# Patient Record
Sex: Male | Born: 1970 | Race: Black or African American | Hispanic: No | Marital: Married | Smoking: Never smoker
Health system: Southern US, Community
[De-identification: ages and names within clinical notes are randomized; demographics above are authoritative.]

## PROBLEM LIST (undated history)

## (undated) DIAGNOSIS — I10 Essential (primary) hypertension: Secondary | ICD-10-CM

## (undated) DIAGNOSIS — I639 Cerebral infarction, unspecified: Secondary | ICD-10-CM

## (undated) DIAGNOSIS — E119 Type 2 diabetes mellitus without complications: Secondary | ICD-10-CM

## (undated) HISTORY — DX: Essential (primary) hypertension: I10

## (undated) HISTORY — DX: Cerebral infarction, unspecified: I63.9

## (undated) HISTORY — DX: Type 2 diabetes mellitus without complications: E11.9

---

## 2003-03-19 ENCOUNTER — Emergency Department (HOSPITAL_COMMUNITY): Admission: EM | Admit: 2003-03-19 | Discharge: 2003-03-19 | Payer: Self-pay | Admitting: Emergency Medicine

## 2006-11-03 ENCOUNTER — Ambulatory Visit (HOSPITAL_COMMUNITY): Admission: RE | Admit: 2006-11-03 | Discharge: 2006-11-03 | Payer: Self-pay | Admitting: Pediatrics

## 2010-09-09 NOTE — Procedures (Signed)
NAMEREZA, CRYMES NO.:  [id number]   MEDICAL RECORD NO.:  [id number]          PATIENT TYPE:  OUT   LOCATION:  [facility]                          FACILITY:  [facility]   PHYSICIAN:  Francoise Schaumann. Halm, DO, FAAPDATE OF BIRTH:  11-02-1970   DATE OF PROCEDURE:  11/03/2006  DATE OF DISCHARGE:                                  STRESS TEST   STUDY BEING PERFORMED:  Exercise treadmill testing under Bruce protocol.   INDICATION FOR TEST:  Pre-exercise and weight loss program screening.   The patient presents with no complaints or symptoms.  He takes  antihypertensive medications including a beta blocker, which we asked  him to hold on the day of his study.   Consent was obtained from the patient after reviewing risks and benefits  of the study.   The patient underwent standard Bruce protocol exercise treadmill testing  and reached an exercise level of over 10 METs, reaching and exceeding  his goal heart rate of 157.  He had no dysrhythmia response to exercise,  a normal blood pressure response to exercise, and he had no symptoms  during the exercise process.  The patient had a maximum ST depression  calculated at 1.7 mm in aVR as his worst EKG tracing.  He had a normal  recovery period.   IMPRESSION:  Excellent tolerance to exercise with negative EKG changes  for ischemia based on Bruce protocol stress testing.      Francoise Schaumann. Milford Cage, DO, FAAP  Electronically Signed     [facility]  D:  11/03/2006  T:  11/03/2006  Job:  [id number]

## 2011-04-28 HISTORY — PX: VASECTOMY: SHX75

## 2012-09-07 ENCOUNTER — Encounter: Payer: Self-pay | Admitting: Medical

## 2012-09-07 ENCOUNTER — Ambulatory Visit (INDEPENDENT_AMBULATORY_CARE_PROVIDER_SITE_OTHER): Payer: 59 | Admitting: Medical

## 2012-09-07 VITALS — BP 118/82 | HR 68 | Temp 98.2°F | Resp 16 | Ht 71.0 in | Wt 245.0 lb

## 2012-09-07 DIAGNOSIS — R81 Glycosuria: Secondary | ICD-10-CM

## 2012-09-07 DIAGNOSIS — Z23 Encounter for immunization: Secondary | ICD-10-CM

## 2012-09-07 DIAGNOSIS — E669 Obesity, unspecified: Secondary | ICD-10-CM

## 2012-09-07 DIAGNOSIS — Z0000 Encounter for general adult medical examination without abnormal findings: Secondary | ICD-10-CM

## 2012-09-07 LAB — COMPREHENSIVE METABOLIC PANEL
ALT: 16 U/L (ref 0–53)
Alkaline Phosphatase: 86 U/L (ref 39–117)
CO2: 28 mEq/L (ref 19–32)
Creat: 1.19 mg/dL (ref 0.50–1.35)
Total Bilirubin: 0.5 mg/dL (ref 0.3–1.2)

## 2012-09-07 LAB — CBC WITH DIFFERENTIAL/PLATELET
Eosinophils Relative: 1 % (ref 0–5)
HCT: 42 % (ref 39.0–52.0)
Lymphocytes Relative: 32 % (ref 12–46)
Lymphs Abs: 2.4 10*3/uL (ref 0.7–4.0)
MCH: 27.5 pg (ref 26.0–34.0)
MCV: 83.8 fL (ref 78.0–100.0)
Monocytes Absolute: 0.4 10*3/uL (ref 0.1–1.0)
RBC: 5.01 MIL/uL (ref 4.22–5.81)
WBC: 7.5 10*3/uL (ref 4.0–10.5)

## 2012-09-07 LAB — POCT URINALYSIS DIPSTICK
Ketones, UA: NEGATIVE
Leukocytes, UA: NEGATIVE
Urobilinogen, UA: NEGATIVE
pH, UA: 5

## 2012-09-07 LAB — LIPID PANEL
HDL: 50 mg/dL (ref >39)
LDL Cholesterol: 120 mg/dL — ABNORMAL HIGH (ref 0–99)
Total CHOL/HDL Ratio: 3.8 Ratio
Triglycerides: 90 mg/dL (ref <150)
VLDL: 18 mg/dL (ref 0–40)

## 2012-09-07 NOTE — Progress Notes (Signed)
Subjective:   HPI  Danny Stevens is a 42 y.o. male who presents for a complete physical.  New patient today.   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  His nephew died unexpectedly a month ago, think he had brain aneurysm or heart issue, age 73yo.  This encouraged him to get a physical.    Preventative care: Last ophthalmology visit:n/a Last dental visit:yes- Dr. Collier Bullock Last colonoscopy:n/a Last prostate exam: n/a Last EKG:09/07/12 Last labs: ?  Prior vaccinations: TD or Tdap:09/07/12  Influenza:never Pneumococcal:n/a Shingles/Zostavax:n/a  Advanced directive:n/a Health care power of attorney:n/a Living will:n/a  Concerns: None  History reviewed. No pertinent past medical history.  Past Surgical History  Procedure Laterality Date  . Vasectomy  2013    Family History  Problem Relation Age of Onset  . Diabetes Mother   . Hypertension Mother   . Stroke Mother     TIA  . Hypertension Father   . Cancer Neg Hx   . Heart disease Neg Hx     History   Social History  . Marital Status: Married    Spouse Name: N/A    Number of Children: N/A  . Years of Education: N/A   Occupational History  . Not on file.   Social History Main Topics  . Smoking status: Never Smoker   . Smokeless tobacco: Not on file  . Alcohol Use: No  . Drug Use: No  . Sexually Active: Not on file   Other Topics Concern  . Not on file   Social History Narrative   Married, 9yo and 4yo sons.   Exercise - walks some, running after kids.   Works for [employer], [employer] fast [employer].      No current outpatient prescriptions on file prior to visit.   No current facility-administered medications on file prior to visit.    No Known Allergies   Reviewed their medical, surgical, family, social, medication, and allergy history and updated chart as appropriate.  Review of Systems Constitutional: -fever, -chills, -sweats, -unexpected weight change, -decreased  appetite, -fatigue Allergy: -sneezing, -itching, -congestion Dermatology: -changing moles, --rash, -lumps ENT: -runny nose, -ear pain, -sore throat, -hoarseness, -sinus pain, -teeth pain, - ringing in ears, -hearing loss, -nosebleeds Cardiology: -chest pain, -palpitations, -swelling, -difficulty breathing when lying flat, -waking up short of breath Respiratory: -cough, -shortness of breath, -difficulty breathing with exercise or exertion, -wheezing, -coughing up blood Gastroenterology: -abdominal pain, -nausea, -vomiting, -diarrhea, -constipation, -blood in stool, -changes in bowel movement, -difficulty swallowing or eating Hematology: -bleeding, -bruising  Musculoskeletal: -joint aches, -muscle aches, -joint swelling, -back pain, -neck pain, -cramping, -changes in gait Ophthalmology: denies vision changes, eye redness, itching, discharge Urology: -burning with urination, -difficulty urinating, -blood in urine, -urinary frequency, -urgency, -incontinence Neurology: -headache, -weakness, -tingling, -numbness, -memory loss, -falls, -dizziness Psychology: -depressed mood, -agitation, -sleep problems     Objective:   Physical Exam  Filed Vitals:   09/07/12 1156  BP: 118/82  Pulse: 68  Temp: 98.2 F (36.8 C)  Resp: 16    General appearance: alert, no distress, WD/WN, obese AA male Skin: right buttock with small flat brown patch of skin he reports as a birth mark, scar left forearm, linear approx 5cm, few scattered benign appearing macules, no other skin findings HEENT: normocephalic, conjunctiva/corneas normal, sclerae anicteric, PERRLA, EOMi, nares patent, no discharge or erythema, pharynx normal Oral cavity: MMM, tongue normal, teeth in good repair Neck: supple, no lymphadenopathy, no thyromegaly, no masses, normal ROM, no  bruits Chest: non tender, normal shape and expansion Heart: RRR, normal S1, S2, no murmurs Lungs: CTA bilaterally, no wheezes, rhonchi, or rales Abdomen: +bs, soft,  small umbilical hernia, non tender, non distended, no masses, no hepatomegaly, no splenomegaly, no bruits Back: non tender, normal ROM, no scoliosis Musculoskeletal: upper extremities non tender, no obvious deformity, normal ROM throughout, lower extremities non tender, no obvious deformity, normal ROM throughout Extremities: no edema, no cyanosis, no clubbing Pulses: 2+ symmetric, upper and lower extremities, normal cap refill Neurological: alert, oriented x 3, CN2-12 intact, strength normal upper extremities and lower extremities, sensation normal throughout, DTRs 2+ throughout, no cerebellar signs, gait normal Psychiatric: normal affect, behavior normal, pleasant  GU: normal male external genitalia, circumcised, nontender, no masses, no hernia, no lymphadenopathy Rectal: anus normal tone, prostrate WNL, no nodules   Adult ECG Report  Indication: physical  Rate: 86 bpm  Rhythm: normal sinus rhythm  QRS Axis: -15 degrees  PR Interval:  QRS Duration:  QTc: 459 ms  Conduction Disturbances: none  Other Abnormalities: left axis deviation, poor R wave progression,   Patient's cardiac risk factors are: male gender and obesity (BMI >= 30 kg/m2).  EKG comparison: no prior  Narrative Interpretation: left axis deviation, poor R wave progression, otherwise normal EKG   Assessment and Plan :      Encounter Diagnoses  Name Primary?  . Routine general medical examination at a health care facility Yes  . Need for Tdap vaccination   . Obesity, unspecified   . Glucosuria     Physical exam - discussed healthy lifestyle, diet, exercise, preventative care, vaccinations, and addressed their concerns.  Advised yearly eye doctor visit.   tdap vaccine, VIS and counseling given  Obesity - advised lifestyle changes  Glucosuria, Follow-up pending labs

## 2012-12-02 ENCOUNTER — Other Ambulatory Visit (HOSPITAL_COMMUNITY): Payer: Self-pay | Admitting: Dentistry

## 2012-12-05 ENCOUNTER — Other Ambulatory Visit (HOSPITAL_COMMUNITY): Payer: Self-pay | Admitting: Dentistry

## 2014-10-04 ENCOUNTER — Ambulatory Visit: Payer: 59 | Admitting: Nutrition

## 2014-10-04 ENCOUNTER — Encounter: Payer: 59 | Admitting: Nutrition

## 2014-10-25 ENCOUNTER — Other Ambulatory Visit: Payer: Self-pay | Admitting: Internal Medicine

## 2014-10-25 DIAGNOSIS — I10 Essential (primary) hypertension: Secondary | ICD-10-CM

## 2014-11-21 ENCOUNTER — Inpatient Hospital Stay: Admission: RE | Admit: 2014-11-21 | Payer: 59 | Source: Ambulatory Visit

## 2014-11-21 ENCOUNTER — Other Ambulatory Visit: Payer: 59

## 2014-11-22 ENCOUNTER — Encounter: Payer: Self-pay | Admitting: Nutrition

## 2014-11-22 ENCOUNTER — Encounter: Payer: 59 | Attending: Internal Medicine | Admitting: Nutrition

## 2014-11-22 VITALS — Ht 72.0 in | Wt 227.0 lb

## 2014-11-22 DIAGNOSIS — Z713 Dietary counseling and surveillance: Secondary | ICD-10-CM | POA: Diagnosis not present

## 2014-11-22 DIAGNOSIS — E118 Type 2 diabetes mellitus with unspecified complications: Secondary | ICD-10-CM

## 2014-11-22 DIAGNOSIS — E669 Obesity, unspecified: Secondary | ICD-10-CM

## 2014-11-22 DIAGNOSIS — IMO0002 Reserved for concepts with insufficient information to code with codable children: Secondary | ICD-10-CM

## 2014-11-22 DIAGNOSIS — Z6830 Body mass index (BMI) 30.0-30.9, adult: Secondary | ICD-10-CM | POA: Insufficient documentation

## 2014-11-22 DIAGNOSIS — E1165 Type 2 diabetes mellitus with hyperglycemia: Secondary | ICD-10-CM | POA: Diagnosis not present

## 2014-11-22 NOTE — Progress Notes (Signed)
  Medical Nutrition Therapy:  Appt start time: 0800 end time:  0930.  Assessment:  Primary concerns today: Diabetes. New Diagnosos about a month or two ago. LIves with wife and kids. His wife does cooking and shopping.  Most foods are grilled and baked and some frying. Eats usually 2 meals per day. Metformin 500 mg BID. Has the Freestyle meter but needs education on using it. Most recent A1C was 9%. He didn't know he was diabetic and found out by annual exam. Strong family history of Dm. Physical activity: walks some. Work full times in the [employer].. Usual weight 260-300 lbs abut a year ago. Has lost about 7 lbs in the last month. DIet is excessive in CHO, low in fresh fruits, vegetables and whole grains and hIgher in fat, sodium and processed foods and sugary drinks.  Preferred Learning Style:    No preference indicated    Learning Readiness:   Ready  Change in progress   MEDICATIONS: See list   DIETARY INTAKE:   24-hr recall:  B ( AM): Banana and water  Snk ( AM):   L ( PM): Cheeseburger and fries, Regular soda Snk ( PM): None D ( PM): Chicken filet sandwiches 1 with tomato and spinach, baked potatoes fries(10-12), Koolaid none Snk ( PM): None Beverages: Water, soda, gatorade, koolaid,   Usual physical activity: walking on the job; and walks the track twice a week.  Estimated energy needs: 2000 calories 225 g carbohydrates 150 g protein 56 g fat  Progress Towards Goal(s):  In progress.   Nutritional Diagnosis:  NB-1.1 Food and nutrition-related knowledge deficit As related to Diabetes.  As evidenced by A1C 9%.    Intervention:  Nutrition and diabetes education provided on disease, CHO counting, meal planning, My plate, portion sizes, balancing meals, target ranges for blood sugars, signs/symptoms of hyper/hypoglycemia and treatment of both, monitoring and training for use of Freestyle Freedom Lite Meter, foot, dental and eye care and prevention of complication  following a low fat low sodium high fiber diet..  Goals:  1. Follow My Plate. 2 l Cut out all sweets, cakes, cookies, pies, desserts, sodas, juices and koolaid. 3. Eat three balanced meals per day. 4. Increase fresh fruits and vegetables. 5. Drink only water.  Test blood sugars twice a day; am and 2 hours after supper or bedtime. 6. Exercise 30-60 minutes 4 -5 days per week. 7. Lose 1-2 lbs per week til next visit. 8. Get A1C to 7% in three months.  Teaching Method Utilized:  Visual Auditory Hands on  Handouts given during visit include:  MY Plate  Meal Plan Card  Barriers to learning/adherence to lifestyle change: None  Demonstrated degree of understanding via:  Teach Back   Monitoring/Evaluation:  Dietary intake, exercise, meal planning, SBG, and body weight in 1 month(s).

## 2014-11-22 NOTE — Patient Instructions (Signed)
Goals:  1. Follow My Plate. 2 l Cut out all sweets, cakes, cookies, pies, desserts, sodas, juices and koolaid. 3. Eat three balanced meals per day. 4. Increase fresh fruits and vegetables. 5. Drink only water.  Test blood sugars twice a day; am and 2 hours after supper or bedtime. 6. Exercise 30-60 minutes 4 -5 days per week. 7. Lose 1-2 lbs per week til next visit. 8. Get A1C to 7% in three months.

## 2015-01-04 ENCOUNTER — Ambulatory Visit: Payer: 59 | Admitting: Nutrition

## 2015-01-04 ENCOUNTER — Telehealth: Payer: Self-pay | Admitting: Nutrition

## 2015-01-04 NOTE — Telephone Encounter (Signed)
Vm to return call to reschedule missed appointment. PC 

## 2015-01-07 ENCOUNTER — Telehealth: Payer: Self-pay | Admitting: Medical

## 2015-01-07 NOTE — Telephone Encounter (Signed)
Left message for pt to call needs appt for CPE per [facility].

## 2015-01-08 ENCOUNTER — Telehealth: Payer: Self-pay | Admitting: Family Medicine

## 2015-01-08 NOTE — Telephone Encounter (Signed)
Pt needs cpe per [facility].  Left message on home ph voice mail .  Cell number not good.

## 2016-04-17 ENCOUNTER — Encounter (HOSPITAL_COMMUNITY): Payer: Self-pay | Admitting: Emergency Medicine

## 2016-04-17 ENCOUNTER — Inpatient Hospital Stay (HOSPITAL_COMMUNITY): Payer: 59

## 2016-04-17 ENCOUNTER — Emergency Department (HOSPITAL_COMMUNITY): Payer: 59

## 2016-04-17 ENCOUNTER — Inpatient Hospital Stay (HOSPITAL_COMMUNITY)
Admission: EM | Admit: 2016-04-17 | Discharge: 2016-04-21 | DRG: 064 | Disposition: A | Discharge status: Home / Self Care | Payer: 59 | Attending: Neurology | Admitting: Neurology

## 2016-04-17 DIAGNOSIS — I69320 Aphasia following cerebral infarction: Secondary | ICD-10-CM

## 2016-04-17 DIAGNOSIS — G936 Cerebral edema: Secondary | ICD-10-CM | POA: Diagnosis present

## 2016-04-17 DIAGNOSIS — Z7984 Long term (current) use of oral hypoglycemic drugs: Secondary | ICD-10-CM | POA: Diagnosis not present

## 2016-04-17 DIAGNOSIS — E1165 Type 2 diabetes mellitus with hyperglycemia: Secondary | ICD-10-CM | POA: Diagnosis not present

## 2016-04-17 DIAGNOSIS — D869 Sarcoidosis, unspecified: Secondary | ICD-10-CM | POA: Diagnosis present

## 2016-04-17 DIAGNOSIS — I6789 Other cerebrovascular disease: Secondary | ICD-10-CM | POA: Diagnosis not present

## 2016-04-17 DIAGNOSIS — I618 Other nontraumatic intracerebral hemorrhage: Secondary | ICD-10-CM | POA: Diagnosis present

## 2016-04-17 DIAGNOSIS — I161 Hypertensive emergency: Secondary | ICD-10-CM | POA: Diagnosis present

## 2016-04-17 DIAGNOSIS — R531 Weakness: Secondary | ICD-10-CM | POA: Diagnosis present

## 2016-04-17 DIAGNOSIS — R29702 NIHSS score 2: Secondary | ICD-10-CM | POA: Diagnosis present

## 2016-04-17 DIAGNOSIS — R591 Generalized enlarged lymph nodes: Secondary | ICD-10-CM

## 2016-04-17 DIAGNOSIS — E876 Hypokalemia: Secondary | ICD-10-CM | POA: Diagnosis present

## 2016-04-17 DIAGNOSIS — E785 Hyperlipidemia, unspecified: Secondary | ICD-10-CM | POA: Diagnosis present

## 2016-04-17 DIAGNOSIS — I612 Nontraumatic intracerebral hemorrhage in hemisphere, unspecified: Secondary | ICD-10-CM | POA: Diagnosis not present

## 2016-04-17 DIAGNOSIS — Z6831 Body mass index (BMI) 31.0-31.9, adult: Secondary | ICD-10-CM | POA: Diagnosis not present

## 2016-04-17 DIAGNOSIS — E119 Type 2 diabetes mellitus without complications: Secondary | ICD-10-CM | POA: Diagnosis present

## 2016-04-17 DIAGNOSIS — I160 Hypertensive urgency: Secondary | ICD-10-CM | POA: Diagnosis present

## 2016-04-17 DIAGNOSIS — I619 Nontraumatic intracerebral hemorrhage, unspecified: Secondary | ICD-10-CM | POA: Diagnosis present

## 2016-04-17 DIAGNOSIS — D649 Anemia, unspecified: Secondary | ICD-10-CM | POA: Diagnosis present

## 2016-04-17 DIAGNOSIS — Z9119 Patient's noncompliance with other medical treatment and regimen: Secondary | ICD-10-CM

## 2016-04-17 DIAGNOSIS — R599 Enlarged lymph nodes, unspecified: Secondary | ICD-10-CM

## 2016-04-17 DIAGNOSIS — IMO0002 Reserved for concepts with insufficient information to code with codable children: Secondary | ICD-10-CM

## 2016-04-17 DIAGNOSIS — I610 Nontraumatic intracerebral hemorrhage in hemisphere, subcortical: Secondary | ICD-10-CM | POA: Diagnosis not present

## 2016-04-17 DIAGNOSIS — I10 Essential (primary) hypertension: Secondary | ICD-10-CM | POA: Diagnosis present

## 2016-04-17 DIAGNOSIS — E669 Obesity, unspecified: Secondary | ICD-10-CM | POA: Diagnosis present

## 2016-04-17 LAB — CBC
HCT: 42.7 % (ref 39.0–52.0)
HEMOGLOBIN: 14.1 g/dL (ref 13.0–17.0)
MCH: 29.1 pg (ref 26.0–34.0)
MCHC: 33 g/dL (ref 30.0–36.0)
MCV: 88 fL (ref 78.0–100.0)
PLATELETS: 234 10*3/uL (ref 150–400)
RBC: 4.85 MIL/uL (ref 4.22–5.81)
RDW: 13.3 % (ref 11.5–15.5)
WBC: 6.1 10*3/uL (ref 4.0–10.5)

## 2016-04-17 LAB — LIPID PANEL
CHOLESTEROL: 198 mg/dL (ref 0–200)
HDL: 60 mg/dL (ref >40)
LDL Cholesterol: 120 mg/dL — ABNORMAL HIGH (ref 0–99)
TRIGLYCERIDES: 91 mg/dL (ref <150)
Total CHOL/HDL Ratio: 3.3 RATIO
VLDL: 18 mg/dL (ref 0–40)

## 2016-04-17 LAB — COMPREHENSIVE METABOLIC PANEL
ALBUMIN: 3.4 g/dL — AB (ref 3.5–5.0)
ALT: 19 U/L (ref 17–63)
ANION GAP: 7 (ref 5–15)
AST: 20 U/L (ref 15–41)
Alkaline Phosphatase: 106 U/L (ref 38–126)
BUN: 16 mg/dL (ref 6–20)
CALCIUM: 9 mg/dL (ref 8.9–10.3)
CO2: 30 mmol/L (ref 22–32)
CREATININE: 1.38 mg/dL — AB (ref 0.61–1.24)
Chloride: 102 mmol/L (ref 101–111)
GFR calc Af Amer: >60 mL/min (ref >60)
GFR calc non Af Amer: >60 mL/min (ref >60)
GLUCOSE: 200 mg/dL — AB (ref 65–99)
Potassium: 3.1 mmol/L — ABNORMAL LOW (ref 3.5–5.1)
SODIUM: 139 mmol/L (ref 135–145)
TOTAL PROTEIN: 7.5 g/dL (ref 6.5–8.1)
Total Bilirubin: 0.5 mg/dL (ref 0.3–1.2)

## 2016-04-17 LAB — PROTIME-INR
INR: 0.93
PROTHROMBIN TIME: 12.4 s (ref 11.4–15.2)

## 2016-04-17 LAB — URINALYSIS, ROUTINE W REFLEX MICROSCOPIC
BILIRUBIN URINE: NEGATIVE
Glucose, UA: 250 mg/dL — AB
KETONES UR: NEGATIVE mg/dL
Leukocytes, UA: NEGATIVE
NITRITE: NEGATIVE
PROTEIN: 30 mg/dL — AB
Specific Gravity, Urine: 1.01 (ref 1.005–1.030)
pH: 6 (ref 5.0–8.0)

## 2016-04-17 LAB — I-STAT CHEM 8, ED
BUN: 16 mg/dL (ref 6–20)
CALCIUM ION: 1.13 mmol/L — AB (ref 1.15–1.40)
CHLORIDE: 100 mmol/L — AB (ref 101–111)
CREATININE: 1.3 mg/dL — AB (ref 0.61–1.24)
Glucose, Bld: 196 mg/dL — ABNORMAL HIGH (ref 65–99)
HEMATOCRIT: 42 % (ref 39.0–52.0)
Hemoglobin: 14.3 g/dL (ref 13.0–17.0)
Potassium: 3.2 mmol/L — ABNORMAL LOW (ref 3.5–5.1)
SODIUM: 142 mmol/L (ref 135–145)
TCO2: 28 mmol/L (ref 0–100)

## 2016-04-17 LAB — DIFFERENTIAL
BASOS ABS: 0 10*3/uL (ref 0.0–0.1)
Basophils Relative: 0 %
Eosinophils Absolute: 0.2 10*3/uL (ref 0.0–0.7)
Eosinophils Relative: 3 %
LYMPHS ABS: 1.4 10*3/uL (ref 0.7–4.0)
LYMPHS PCT: 23 %
Monocytes Absolute: 0.6 10*3/uL (ref 0.1–1.0)
Monocytes Relative: 10 %
NEUTROS ABS: 3.9 10*3/uL (ref 1.7–7.7)
NEUTROS PCT: 64 %

## 2016-04-17 LAB — URINALYSIS, MICROSCOPIC (REFLEX)
Bacteria, UA: NONE SEEN
Squamous Epithelial / HPF: NONE SEEN
WBC, UA: NONE SEEN WBC/hpf (ref 0–5)

## 2016-04-17 LAB — I-STAT TROPONIN, ED: Troponin i, poc: 0.03 ng/mL (ref 0.00–0.08)

## 2016-04-17 LAB — RAPID URINE DRUG SCREEN, HOSP PERFORMED
AMPHETAMINES: NOT DETECTED
BARBITURATES: NOT DETECTED
Benzodiazepines: NOT DETECTED
Cocaine: NOT DETECTED
OPIATES: NOT DETECTED
TETRAHYDROCANNABINOL: NOT DETECTED

## 2016-04-17 LAB — MRSA PCR SCREENING: MRSA by PCR: NEGATIVE

## 2016-04-17 LAB — GLUCOSE, CAPILLARY
GLUCOSE-CAPILLARY: 271 mg/dL — AB (ref 65–99)
GLUCOSE-CAPILLARY: 105 mg/dL — AB (ref 65–99)
Glucose-Capillary: 241 mg/dL — ABNORMAL HIGH (ref 65–99)
Glucose-Capillary: 225 mg/dL — ABNORMAL HIGH (ref 65–99)

## 2016-04-17 LAB — CBG MONITORING, ED: Glucose-Capillary: 196 mg/dL — ABNORMAL HIGH (ref 65–99)

## 2016-04-17 LAB — ETHANOL: Alcohol, Ethyl (B): <5 mg/dL (ref <5)

## 2016-04-17 LAB — APTT: aPTT: 31 seconds (ref 24–36)

## 2016-04-17 LAB — TYPE AND SCREEN
ABO/RH(D): A POS
Antibody Screen: NEGATIVE

## 2016-04-17 LAB — I-STAT CG4 LACTIC ACID, ED: Lactic Acid, Venous: 1.71 mmol/L (ref 0.5–1.9)

## 2016-04-17 MED ORDER — SENNOSIDES-DOCUSATE SODIUM 8.6-50 MG PO TABS
1.0000 | ORAL_TABLET | Freq: Two times a day (BID) | ORAL | Status: DC
  Administered 2016-04-17 – 2016-04-21 (×9): 1 via ORAL
  Filled 2016-04-17 (×9): qty 1

## 2016-04-17 MED ORDER — CLEVIDIPINE BUTYRATE 0.5 MG/ML IV EMUL
0.0000 mg/h | INTRAVENOUS | Status: DC
  Administered 2016-04-17: 1 mg/h via INTRAVENOUS
  Administered 2016-04-17: 6 mg/h via INTRAVENOUS
  Administered 2016-04-18: 10 mg/h via INTRAVENOUS
  Administered 2016-04-18 (×2): 6 mg/h via INTRAVENOUS
  Administered 2016-04-19: 17 mg/h via INTRAVENOUS
  Administered 2016-04-19: 12 mg/h via INTRAVENOUS
  Administered 2016-04-19: 11 mg/h via INTRAVENOUS
  Filled 2016-04-17 (×10): qty 50

## 2016-04-17 MED ORDER — INSULIN ASPART 100 UNIT/ML ~~LOC~~ SOLN
0.0000 [IU] | Freq: Three times a day (TID) | SUBCUTANEOUS | Status: DC
  Administered 2016-04-17: 5 [IU] via SUBCUTANEOUS
  Administered 2016-04-17: 8 [IU] via SUBCUTANEOUS
  Administered 2016-04-18: 5 [IU] via SUBCUTANEOUS
  Administered 2016-04-18: 3 [IU] via SUBCUTANEOUS
  Administered 2016-04-18: 5 [IU] via SUBCUTANEOUS
  Administered 2016-04-19 (×2): 3 [IU] via SUBCUTANEOUS
  Administered 2016-04-19: 8 [IU] via SUBCUTANEOUS
  Administered 2016-04-20: 2 [IU] via SUBCUTANEOUS
  Administered 2016-04-20: 5 [IU] via SUBCUTANEOUS
  Administered 2016-04-20: 3 [IU] via SUBCUTANEOUS
  Administered 2016-04-21 (×2): 2 [IU] via SUBCUTANEOUS

## 2016-04-17 MED ORDER — AMLODIPINE BESYLATE 10 MG PO TABS
10.0000 mg | ORAL_TABLET | Freq: Every day | ORAL | Status: DC
  Administered 2016-04-17 – 2016-04-21 (×5): 10 mg via ORAL
  Filled 2016-04-17 (×5): qty 1

## 2016-04-17 MED ORDER — ATORVASTATIN CALCIUM 40 MG PO TABS
40.0000 mg | ORAL_TABLET | Freq: Every day | ORAL | Status: DC
  Administered 2016-04-17: 40 mg via ORAL
  Filled 2016-04-17: qty 1

## 2016-04-17 MED ORDER — STROKE: EARLY STAGES OF RECOVERY BOOK
Freq: Once | Status: AC
Start: 2016-04-17 — End: 2016-04-17
  Administered 2016-04-17: 07:00:00
  Filled 2016-04-17: qty 1

## 2016-04-17 MED ORDER — NICARDIPINE HCL IN NACL 20-0.86 MG/200ML-% IV SOLN
INTRAVENOUS | Status: AC
  Filled 2016-04-17: qty 200

## 2016-04-17 MED ORDER — LABETALOL HCL 5 MG/ML IV SOLN
20.0000 mg | Freq: Once | INTRAVENOUS | Status: AC
  Administered 2016-04-17: 20 mg via INTRAVENOUS
  Filled 2016-04-17: qty 4

## 2016-04-17 MED ORDER — ACETAMINOPHEN 325 MG PO TABS: 650.0000 mg | ORAL_TABLET | ORAL | Status: DC | PRN

## 2016-04-17 MED ORDER — LABETALOL HCL 5 MG/ML IV SOLN
20.0000 mg | Freq: Once | INTRAVENOUS | Status: AC
Start: 2016-04-17 — End: 2016-04-17
  Administered 2016-04-17: 20 mg via INTRAVENOUS
  Filled 2016-04-17: qty 4

## 2016-04-17 MED ORDER — IOPAMIDOL (ISOVUE-370) INJECTION 76%
INTRAVENOUS | Status: AC
  Administered 2016-04-17: 50 mL
  Filled 2016-04-17: qty 50

## 2016-04-17 MED ORDER — PANTOPRAZOLE SODIUM 40 MG PO TBEC
40.0000 mg | DELAYED_RELEASE_TABLET | Freq: Every day | ORAL | Status: DC
  Administered 2016-04-17 – 2016-04-21 (×5): 40 mg via ORAL
  Filled 2016-04-17 (×5): qty 1

## 2016-04-17 MED ORDER — ACETAMINOPHEN 650 MG RE SUPP: 650.0000 mg | RECTAL | Status: DC | PRN

## 2016-04-17 MED ORDER — NICARDIPINE HCL IN NACL 20-0.86 MG/200ML-% IV SOLN
3.0000 mg/h | Freq: Once | INTRAVENOUS | Status: AC
  Administered 2016-04-17: 3 mg/h via INTRAVENOUS
  Filled 2016-04-17: qty 200

## 2016-04-17 MED ORDER — LABETALOL HCL 100 MG PO TABS
200.0000 mg | ORAL_TABLET | Freq: Three times a day (TID) | ORAL | Status: DC
  Administered 2016-04-17 – 2016-04-19 (×8): 200 mg via ORAL
  Filled 2016-04-17 (×9): qty 2

## 2016-04-17 MED ORDER — ACETAMINOPHEN 160 MG/5ML PO SOLN: 650.0000 mg | ORAL | Status: DC | PRN

## 2016-04-17 MED ORDER — PANTOPRAZOLE SODIUM 40 MG IV SOLR: 40.0000 mg | Freq: Every day | INTRAVENOUS | Status: DC

## 2016-04-17 MED ORDER — NICARDIPINE HCL IN NACL 20-0.86 MG/200ML-% IV SOLN
0.0000 mg/h | INTRAVENOUS | Status: DC
  Administered 2016-04-17: 10 mg/h via INTRAVENOUS
  Administered 2016-04-17: 13 mg/h via INTRAVENOUS
  Filled 2016-04-17: qty 200

## 2016-04-17 MED ORDER — SODIUM CHLORIDE 0.9 % IV SOLN
INTRAVENOUS | Status: DC
  Administered 2016-04-17: 07:00:00 via INTRAVENOUS

## 2016-04-17 MED ORDER — INSULIN ASPART 100 UNIT/ML ~~LOC~~ SOLN: 0.0000 [IU] | Freq: Every day | SUBCUTANEOUS | Status: DC

## 2016-04-17 NOTE — Evaluation (Signed)
Physical Therapy Evaluation Patient Details Name: Danny OrmondBobby Stevens MRN: [id number] DOB: 1970-09-21 Today's Date: 04/17/2016   History of Present Illness  Patient is a 45 y/o male with hx of DM, HTN, noncompliance with treatment who presents to the ED at [facility] with acute onset of confusion, speech change and right-sided weakness. NIH 2. CT- acute left thalamic infarction, as well as a smaller acute right thalamic infarction  Clinical Impression  Patient presents with weakness and decreased awareness of items/objects on right side during mobility, question visual deficits? Pt independent PTA. Tolerated gait training with min guard assist for safety but did need to push IV pole for support. Recommend further visual assessment as pt seems to have some inattention to right side vs visual deficits notable during mobility and navigating the environment. Will follow acutely to maximize independence and mobility prior to return home.     Follow Up Recommendations Outpatient PT    Equipment Recommendations  None recommended by PT    Recommendations for Other Services       Precautions / Restrictions Precautions Precautions: Fall Precaution Comments: right sided visual deficits?? Restrictions Weight Bearing Restrictions: No      Mobility  Bed Mobility Overal bed mobility: Modified Independent             General bed mobility comments: Increased time. No assist needed.   Transfers Overall transfer level: Needs assistance Equipment used: None Transfers: Sit to/from Stand Sit to Stand: Min guard         General transfer comment: Min guard due to first time being up. transferred to chair post ambulation.   Ambulation/Gait Ambulation/Gait assistance: Min guard Ambulation Distance (Feet): 175 Feet Assistive device:  (IV pole) Gait Pattern/deviations: Step-through pattern;Decreased stride length;Staggering right Gait velocity: decreased Gait velocity interpretation:  Below normal speed for age/gender General Gait Details: Slow, mostly steady gait. Pt bumping into all objects on right side despite cues. Unaware of this. VSS.  Stairs            Wheelchair Mobility    Modified Rankin (Stroke Patients Only) Modified Rankin (Stroke Patients Only) Pre-Morbid Rankin Score: No symptoms Modified Rankin: Moderately severe disability     Balance Overall balance assessment: Needs assistance Sitting-balance support: Feet supported;No upper extremity supported Sitting balance-Leahy Scale: Good     Standing balance support: During functional activity Standing balance-Leahy Scale: Fair                               Pertinent Vitals/Pain Pain Assessment: No/denies pain    Home Living Family/patient expects to be discharged to:: Private residence Living Arrangements: Spouse/significant other (2 kids- 7 and 912 y/o) Available Help at Discharge: Family;Available PRN/intermittently Type of Home: House Home Access: Level entry     Home Layout: Two level;Bed/bath upstairs Home Equipment: None      Prior Function Level of Independence: Independent         Comments: 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     Hand Dominance        Extremity/Trunk Assessment   Upper Extremity Assessment Upper Extremity Assessment: Defer to OT evaluation    Lower Extremity Assessment Lower Extremity Assessment: Generalized weakness    Cervical / Trunk Assessment Cervical / Trunk Assessment: Normal  Communication   Communication: No difficulties  Cognition Arousal/Alertness: Awake/alert Behavior During Therapy: WFL for tasks assessed/performed Overall Cognitive Status: No family/caregiver present to determine baseline cognitive functioning Area of  Impairment: Safety/judgement         Safety/Judgement: Decreased awareness of deficits     General Comments: Some delayed processing noted at times. Pt reports no visual  deficits. Able to see correct number of fingers in periphery during testing but noted deficits on right during mobility.     General Comments      Exercises     Assessment/Plan    PT Assessment Patient needs continued PT services  PT Problem List Decreased strength;Decreased mobility;Decreased safety awareness;Decreased cognition;Decreased balance          PT Treatment Interventions Therapeutic activities;Gait training;Therapeutic exercise;Patient/family education;Stair training;Balance training;Functional mobility training;Neuromuscular re-education    PT Goals (Current goals can be found in the Care Plan section)  Acute Rehab PT Goals Patient Stated Goal: to go home for xmas PT Goal Formulation: With patient Time For Goal Achievement: 05/01/16 Potential to Achieve Goals: Good    Frequency Min 4X/week   Barriers to discharge Inaccessible home environment;Decreased caregiver support      Co-evaluation               End of Session Equipment Utilized During Treatment: Gait belt Activity Tolerance: Patient tolerated treatment well Patient left: in chair;with call bell/phone within reach;with chair alarm set Nurse Communication: Mobility status         Time: 1436-1456 PT Time Calculation (min) (ACUTE ONLY): 20 min   Charges:   PT Evaluation $PT Eval Moderate Complexity: 1 Procedure     PT G Codes:        Latisa Belay A Devika Dragovich 04/17/2016, 3:21 PM Mylo RedShauna Niza Soderholm, PT, DPT [phone]

## 2016-04-17 NOTE — H&P (Addendum)
Admission H&P    Chief Complaint: Acute onset of speech difficulty, confusion and right-sided weakness.  HPI: Danny Stevens is an 45 y.o. male with a history of diabetes mellitus and hypertension and noncompliance with treatment who presented to the ED at [facility] acute onset of confusion, speech change and right-sided weakness. Code stroke was activated. A pressure was markedly elevated. CT scan of his head showed an acute left thalamic infarction, as well as a smaller acute right thalamic infarction. There was no ventricular extension of hemorrhage. Minimal localized edema was noted on the left. Blood pressure was 233/139 on admission to the ED. He was given labetalol and subsequently started on Cardene drip. He was transferred to [facility] for further management. NIH stroke score was 2 time of this evaluation.  LSN: 2:00 AM on 04/17/2016 tPA Given: No: Acute ICH mRankin:  Past Medical History:  Diagnosis Date  . Diabetes mellitus without complication ([place])   . Hypertension     Past Surgical History:  Procedure Laterality Date  . VASECTOMY  2013    Family History  Problem Relation Age of Onset  . Diabetes Mother   . Hypertension Mother   . Stroke Mother     TIA  . Hypertension Father   . Cancer Neg Hx   . Heart disease Neg Hx    Social History:  reports that he has never smoked. He has never used smokeless tobacco. He reports that he does not drink alcohol or use drugs.  Allergies: No Known Allergies  Medications Prior to Admission  Medication Sig Dispense Refill  . metFORMIN (GLUCOPHAGE) 500 MG tablet Take 500 mg by mouth.      ROS: History obtained from the patient  General ROS: negative for - chills, fatigue, fever, night sweats, weight gain or weight loss Psychological ROS: negative for - behavioral disorder, hallucinations, memory difficulties, mood swings or suicidal ideation Ophthalmic ROS: negative for - blurry vision, double vision, eye pain or loss of  vision ENT ROS: negative for - epistaxis, nasal discharge, oral lesions, sore throat, tinnitus or vertigo Allergy and Immunology ROS: negative for - hives or itchy/watery eyes Hematological and Lymphatic ROS: negative for - bleeding problems, bruising or swollen lymph nodes Endocrine ROS: negative for - galactorrhea, hair pattern changes, polydipsia/polyuria or temperature intolerance Respiratory ROS: negative for - cough, hemoptysis, shortness of breath or wheezing Cardiovascular ROS: negative for - chest pain, dyspnea on exertion, edema or irregular heartbeat Gastrointestinal ROS: negative for - abdominal pain, diarrhea, hematemesis, nausea/vomiting or stool incontinence Genito-Urinary ROS: negative for - dysuria, hematuria, incontinence or urinary frequency/urgency Musculoskeletal ROS: negative for - joint swelling or muscular weakness Neurological ROS: as noted in HPI Dermatological ROS: negative for rash and skin lesion changes  Physical Examination: Blood pressure (!) 180/102, pulse (!) 120, temperature 98 F (36.7 C), temperature source Oral, resp. rate (!) 21, height 6' (1.829 m), weight 105.3 kg (232 lb 2.3 oz), SpO2 95 %.  HEENT-  Normocephalic, no lesions, without obvious abnormality.  Normal external eye and conjunctiva.  Normal TM's bilaterally.  Normal auditory canals and external ears. Normal external nose, mucus membranes and septum.  Normal pharynx. Neck supple with no masses, nodes, nodules or enlargement. Cardiovascular - tachycardic with regular rhythm, normal S1 and S2, no murmur or gallop Lungs - chest clear, no wheezing, rales, normal symmetric air entry Abdomen - soft, non-tender; bowel sounds normal; no masses,  no organomegaly Extremities - no joint deformities, effusion, or inflammation and no edema  Neurologic Examination: Mental Status: Alert, oriented, no acute distress.  Speech fluent without evidence of aphasia. Able to follow commands without  difficulty. Cranial Nerves: II-Visual fields were normal. III/IV/VI-Pupils were equal and reacted normally to light. Extraocular movements were full and conjugate.  V/VII-no facial numbness; minimal right lower facial weakness. VIII-normal. X-normal speech and symmetrical palatal movement. XI: trapezius strength/neck flexion strength normal bilaterally XII-midline tongue extension with normal strength. Motor: Minimal drift of right upper extremity; motor exam otherwise unremarkable. Sensory: Normal throughout. Deep Tendon Reflexes: 1+ and symmetric. Plantars: Flexor bilaterally Cerebellar: Normal finger-to-nose testing. Carotid auscultation: Normal  Results for orders placed or performed during the hospital encounter of 04/17/16 (from the past 48 hour(s))  Urinalysis, Routine w reflex microscopic     Status: Abnormal   Collection Time: 04/17/16  3:40 AM  Result Value Ref Range   Color, Urine YELLOW YELLOW   APPearance CLEAR CLEAR   Specific Gravity, Urine 1.010 1.005 - 1.030   pH 6.0 5.0 - 8.0   Glucose, UA 250 (A) NEGATIVE mg/dL   Hgb urine dipstick TRACE (A) NEGATIVE   Bilirubin Urine NEGATIVE NEGATIVE   Ketones, ur NEGATIVE NEGATIVE mg/dL   Protein, ur 30 (A) NEGATIVE mg/dL   Nitrite NEGATIVE NEGATIVE   Leukocytes, UA NEGATIVE NEGATIVE  Urinalysis, Microscopic (reflex)     Status: None   Collection Time: 04/17/16  3:40 AM  Result Value Ref Range   RBC / HPF 0-5 0 - 5 RBC/hpf   WBC, UA NONE SEEN 0 - 5 WBC/hpf   Bacteria, UA NONE SEEN NONE SEEN   Squamous Epithelial / LPF NONE SEEN NONE SEEN  Ethanol     Status: None   Collection Time: 04/17/16  3:45 AM  Result Value Ref Range   Alcohol, Ethyl (B) <5 <5 mg/dL    Comment:        LOWEST DETECTABLE LIMIT FOR SERUM ALCOHOL IS 5 mg/dL FOR MEDICAL PURPOSES ONLY   Protime-INR     Status: None   Collection Time: 04/17/16  3:45 AM  Result Value Ref Range   Prothrombin Time 12.4 11.4 - 15.2 seconds   INR 0.93   APTT      Status: None   Collection Time: 04/17/16  3:45 AM  Result Value Ref Range   aPTT 31 24 - 36 seconds  CBC     Status: None   Collection Time: 04/17/16  3:45 AM  Result Value Ref Range   WBC 6.1 4.0 - 10.5 K/uL   RBC 4.85 4.22 - 5.81 MIL/uL   Hemoglobin 14.1 13.0 - 17.0 g/dL   HCT 99.0 85.2 - 05.0 %   MCV 88.0 78.0 - 100.0 fL   MCH 29.1 26.0 - 34.0 pg   MCHC 33.0 30.0 - 36.0 g/dL   RDW 50.9 18.5 - 99.5 %   Platelets 234 150 - 400 K/uL  Differential     Status: None   Collection Time: 04/17/16  3:45 AM  Result Value Ref Range   Neutrophils Relative % 64 %   Neutro Abs 3.9 1.7 - 7.7 K/uL   Lymphocytes Relative 23 %   Lymphs Abs 1.4 0.7 - 4.0 K/uL   Monocytes Relative 10 %   Monocytes Absolute 0.6 0.1 - 1.0 K/uL   Eosinophils Relative 3 %   Eosinophils Absolute 0.2 0.0 - 0.7 K/uL   Basophils Relative 0 %   Basophils Absolute 0.0 0.0 - 0.1 K/uL  Comprehensive metabolic panel     Status: Abnormal  Collection Time: 04/17/16  3:45 AM  Result Value Ref Range   Sodium 139 135 - 145 mmol/L   Potassium 3.1 (L) 3.5 - 5.1 mmol/L   Chloride 102 101 - 111 mmol/L   CO2 30 22 - 32 mmol/L   Glucose, Bld 200 (H) 65 - 99 mg/dL   BUN 16 6 - 20 mg/dL   Creatinine, Ser 1.38 (H) 0.61 - 1.24 mg/dL   Calcium 9.0 8.9 - 10.3 mg/dL   Total Protein 7.5 6.5 - 8.1 g/dL   Albumin 3.4 (L) 3.5 - 5.0 g/dL   AST 20 15 - 41 U/L   ALT 19 17 - 63 U/L   Alkaline Phosphatase 106 38 - 126 U/L   Total Bilirubin 0.5 0.3 - 1.2 mg/dL   GFR calc non Af Amer >60 >60 mL/min   GFR calc Af Amer >60 >60 mL/min    Comment: (NOTE) The eGFR has been calculated using the CKD EPI equation. This calculation has not been validated in all clinical situations. eGFR's persistently <60 mL/min signify possible Chronic Kidney Disease.    Anion gap 7 5 - 15  Type and screen [facility]     Status: None   Collection Time: 04/17/16  3:45 AM  Result Value Ref Range   ABO/RH(D) A POS    Antibody Screen NEG    Sample  Expiration 04/20/2016   CBG monitoring, ED     Status: Abnormal   Collection Time: 04/17/16  3:52 AM  Result Value Ref Range   Glucose-Capillary 196 (H) 65 - 99 mg/dL  I-Stat Chem 8, ED  (not at [facility], [facility])     Status: Abnormal   Collection Time: 04/17/16  3:57 AM  Result Value Ref Range   Sodium 142 135 - 145 mmol/L   Potassium 3.2 (L) 3.5 - 5.1 mmol/L   Chloride 100 (L) 101 - 111 mmol/L   BUN 16 6 - 20 mg/dL   Creatinine, Ser 1.30 (H) 0.61 - 1.24 mg/dL   Glucose, Bld 196 (H) 65 - 99 mg/dL   Calcium, Ion 1.13 (L) 1.15 - 1.40 mmol/L   TCO2 28 0 - 100 mmol/L   Hemoglobin 14.3 13.0 - 17.0 g/dL   HCT 42.0 39.0 - 52.0 %  I-Stat CG4 Lactic Acid, ED     Status: None   Collection Time: 04/17/16  3:58 AM  Result Value Ref Range   Lactic Acid, Venous 1.71 0.5 - 1.9 mmol/L  I-stat troponin, ED (not at [facility], [facility])     Status: None   Collection Time: 04/17/16  4:06 AM  Result Value Ref Range   Troponin i, poc 0.03 0.00 - 0.08 ng/mL   Comment 3            Comment: Due to the release kinetics of cTnI, a negative result within the first hours of the onset of symptoms does not rule out myocardial infarction with certainty. If myocardial infarction is still suspected, repeat the test at appropriate intervals.   Urine rapid drug screen (hosp performed)not at [facility]     Status: None   Collection Time: 04/17/16  4:20 AM  Result Value Ref Range   Opiates NONE DETECTED NONE DETECTED   Cocaine NONE DETECTED NONE DETECTED   Benzodiazepines NONE DETECTED NONE DETECTED   Amphetamines NONE DETECTED NONE DETECTED   Tetrahydrocannabinol NONE DETECTED NONE DETECTED   Barbiturates NONE DETECTED NONE DETECTED    Comment:        DRUG SCREEN FOR  MEDICAL PURPOSES ONLY.  IF CONFIRMATION IS NEEDED FOR ANY PURPOSE, NOTIFY LAB WITHIN 5 DAYS.        LOWEST DETECTABLE LIMITS FOR URINE DRUG SCREEN Drug Class       Cutoff (ng/mL) Amphetamine      1000 Barbiturate      200 Benzodiazepine   914 Tricyclics        782 Opiates          300 Cocaine          300 THC              50    Ct Head Code Stroke Wo Contrast`  Result Date: 04/17/2016 CLINICAL DATA:  Code stroke. Initial evaluation for acute right-sided weakness. EXAM: CT HEAD WITHOUT CONTRAST TECHNIQUE: Contiguous axial images were obtained from the base of the skull through the vertex without intravenous contrast. COMPARISON:  None. FINDINGS: Brain: Acute intraparenchymal hemorrhage centered at the left thalamus measures 21 x 16 x 19 mm (estimated volume 3.2 cc). Minimal localized edema without significant mass effect. No breakthrough into the adjacent left lateral or third ventricles. No significant midline shift. There is an additional faint somewhat linear hemorrhage within the right thalamus (series 2, image 14). Hemorrhage measures approximately 16 x 3 x 5 mm. No associated mass effect. No intraventricular extension. No other acute intracranial hemorrhage. No other evidence for acute large vessel territory infarct. Mild atrophy with chronic small vessel ischemic disease. No mass lesion. No hydrocephalus. No extra-axial fluid collection. Vascular: No hyperdense vessel. Skull: Scalp soft tissues within normal limits.  Calvarium intact. Sinuses/Orbits: Globes and orbital soft tissues within normal limits. Visualized paranasal sinuses and mastoids are clear. IMPRESSION: 1. 21 x 16 x 19 mm left thalamic hemorrhage (estimated volume 3.2 cc). Mild localized edema without significant mass effect. No intraventricular extension. 2. Smaller 16 x 3 x 5 mm acute right thalamic hemorrhage without significant mass effect or intraventricular extension. Hypertensive etiology is suspected. 3. Mild atrophy with chronic microvascular ischemic disease. Critical Value/emergent results were called by telephone at the time of interpretation on 04/17/2016 at 3:55 am to Dr. Ezequiel Essex , who verbally acknowledged these results. Electronically Signed   By: Jeannine Boga  M.D.   On: 04/17/2016 03:58    Assessment: 45 y.o. male with multiple risk factors for stroke and noncompliance with treatment of hypertension and diabetes mellitus, presenting with acute right and left thalamic hemorrhages.  Stroke Risk Factors - diabetes mellitus, family history and hypertension  Plan: 1. HgbA1c, fasting lipid panel 2. MRI, MRA  of the brain without contrast 3. PT consult, OT consult, Speech consult 4. Echocardiogram 5. Carotid dopplers 6. Prophylactic therapy-None 7. Risk factor modification 8. Telemetry monitoring 9. Hypercoagulopathy panel  This patient is critically ill and at significant risk of neurological worsening or death, and care requires constant monitoring of vital signs, hemodynamics,respiratory and cardiac monitoring, neurological assessment, discussion with family, other specialists and medical decision making of high complexity. Total critical care time was 55 minutes.  C.R. Nicole Kindred, MD [facility] [phone] 04/17/2016, 7:12 AM
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
## 2016-04-17 NOTE — ED Notes (Addendum)
0335 Called Code Stroke 0336 Called SOC [id number] Placed Camera in room [id number] [facility]

## 2016-04-17 NOTE — Evaluation (Signed)
Speech Language Pathology Evaluation Patient Details Name: Danny Stevens MRN: [id number] DOB: Apr 19, 1971 Today's Date: 04/17/2016 Time: [id number] SLP Time Calculation (min) (ACUTE ONLY): 36 min  Problem List:  Patient Active Problem List   Diagnosis Date Noted  . ICH (intracerebral hemorrhage) (HCC) 04/17/2016   Past Medical History:  Past Medical History:  Diagnosis Date  . Diabetes mellitus without complication (HCC)   . Hypertension    Past Surgical History:  Past Surgical History:  Procedure Laterality Date  . VASECTOMY  2013   HPI:  Danny Stevens an 45 y.o.malewith a history of diabetes mellitus and hypertension and noncompliance with treatment who presented to the ED at [facility] acute onset of confusion, speech change and right-sided weakness. Code stroke was activated. A pressure was markedly elevated. CT scan of his head showed an acute left thalamic infarction, as well as a smaller acute right thalamic infarction. There was no ventricular extension of hemorrhage. Minimal localized edema was noted on the left. [facility].   Assessment / Plan / Recommendation Clinical Impression  Patient presents with mild cognitive communication impairment characterized by deficits in short-term recall, viusospatial/executive function, and attention. MOCA was administered; he obtained a score of 19/30 (above 26 is WNL). Patient appears unaware of deficits, states he feels he is at baseline functioning. PT questions visual deficits, notes patient bumps into things on right side. He is left-handed. Deficits are higher level; no further skilled SLP services are recommended at this level of care. Provided patient with education regarding impact of stroke of cognitive communication functioning, provided strategies to improve functional memory problems, and recommended follow-up with SLP in outpatient setting. SLP will sign off.     SLP Assessment  All  further Speech Lanaguage Pathology  needs can be addressed in the next venue of care    Follow Up Recommendations  Outpatient SLP    Frequency and Duration           SLP Evaluation Cognition  Overall Cognitive Status: No family/caregiver present to determine baseline cognitive functioning Arousal/Alertness: Awake/alert Orientation Level: Oriented X4 Memory: Impaired Memory Impairment: Decreased short term memory Decreased Short Term Memory: Verbal complex;Functional complex Awareness: Impaired Awareness Impairment: Other (comment) (reduced insight into deficits) Problem Solving: Impaired Problem Solving Impairment: Functional complex Executive Function: Sequencing;Self Monitoring Sequencing: Impaired Sequencing Impairment: Verbal complex Self Monitoring: Impaired Self Monitoring Impairment: Functional complex Safety/Judgment: Impaired       Comprehension  Auditory Comprehension Overall Auditory Comprehension: Appears within functional limits for tasks assessed Visual Recognition/Discrimination Discrimination: Within Function Limits Reading Comprehension Reading Status: Not tested    Expression Expression Primary Mode of Expression: Verbal Verbal Expression Overall Verbal Expression: Appears within functional limits for tasks assessed Initiation: No impairment Written Expression Dominant Hand: Left Written Expression: Not tested   Oral / Motor  Oral Motor/Sensory Function Overall Oral Motor/Sensory Function: Within functional limits Motor Speech Overall Motor Speech: Appears within functional limits for tasks assessed   GO            Danny BatonMary Beth Pasqual Farias, MS CF-SLP Speech-Language Pathologist          Danny Stevens 04/17/2016, 4:07 PM

## 2016-04-17 NOTE — Progress Notes (Signed)
OT Cancellation Note  Patient Details Name: Danny OrmondBobby Stevens MRN: [id number] DOB: [birth year]   Cancelled Treatment:    Reason Eval/Treat Not Completed: Patient at procedure or test/ unavailable  Felecia ShellingJones, Evola Hollis B   Mattalyn Anderegg, Brynn   OTR/L Pager: [phone] Office: [phone] .  04/17/2016, 1:18 PM

## 2016-04-17 NOTE — Progress Notes (Signed)
STROKE TEAM PROGRESS NOTE   HISTORY OF PRESENT ILLNESS (per record) Danny Stevens is an 45 y.o. male with a history of diabetes mellitus and hypertension and noncompliance with treatment who presented to the ED at [facility] acute onset of confusion, speech change and right-sided weakness. Code stroke was activated. Blood pressure was markedly elevated. CT scan of his head showed an acute left thalamic hemorrhage, as well as a smaller acute right thalamic hemorrhage. There was no ventricular extension of hemorrhage. Minimal localized edema was noted on the left. Blood pressure was 233/139 on admission to the ED. He was given labetalol and subsequently started on Cardene drip. He was transferred to [facility] for further management. NIH stroke score was 2 time of this evaluation.  Patient was not administered IV t-PA secondary to ICH. He was admitted to the neuro ICU for further evaluation and treatment.  SUBJECTIVE (INTERVAL HISTORY) Patient was seen at the bedside without family. Overall he feels his condition is rapidly improving. Patient reiterates that this morning he awoke to go to work, got in the care, put it in reverse, but was unable to start driving due to confusion. He was able to put it in park, but sat in the car for a while. Due to this, his wife came out to check on him and found him confused. He states his confusion lasted for about 10-15 minutes. He does not endorse weakness or speech changes. Currently, patient feels better and close to his baseline.   OBJECTIVE Temp:  [97.6 F (36.4 C)-98.7 F (37.1 C)] 98.7 F (37.1 C) (12/22 1200) Pulse Rate:  [83-120] 88 (12/22 1230) Resp:  [10-27] 14 (12/22 1230) BP: (133-233)/(77-139) 152/82 (12/22 1230) SpO2:  [90 %-100 %] 96 % (12/22 1230) Weight:  [232 lb 2.3 oz (105.3 kg)-241 lb (109.3 kg)] 232 lb 2.3 oz (105.3 kg) (12/22 0600)  CBC:   Recent Labs Lab 04/17/16 0345 04/17/16 0357  WBC 6.1  --   NEUTROABS 3.9  --   HGB 14.1  14.3  HCT 42.7 42.0  MCV 88.0  --   PLT 234  --     Basic Metabolic Panel:   Recent Labs Lab 04/17/16 0345 04/17/16 0357  NA 139 142  K 3.1* 3.2*  CL 102 100*  CO2 30  --   GLUCOSE 200* 196*  BUN 16 16  CREATININE 1.38* 1.30*  CALCIUM 9.0  --     Lipid Panel:     Component Value Date/Time   CHOL 198 04/17/2016 1012   TRIG 91 04/17/2016 1012   HDL 60 04/17/2016 1012   CHOLHDL 3.3 04/17/2016 1012   VLDL 18 04/17/2016 1012   LDLCALC 120 (H) 04/17/2016 1012    Urine Drug Screen:     Component Value Date/Time   LABOPIA NONE DETECTED 04/17/2016 0420   COCAINSCRNUR NONE DETECTED 04/17/2016 0420   LABBENZ NONE DETECTED 04/17/2016 0420   AMPHETMU NONE DETECTED 04/17/2016 0420   THCU NONE DETECTED 04/17/2016 0420   LABBARB NONE DETECTED 04/17/2016 0420     IMAGING  Ct Angio Head W Or Wo Contrast  Result Date: 04/17/2016 CLINICAL DATA:  Subsequent evaluation of intracranial hemorrhage. Acute onset of RIGHT-sided weakness. History of hypertension with noncompliance. EXAM: CT ANGIOGRAPHY HEAD AND NECK TECHNIQUE: Multidetector CT imaging of the head and neck was performed using the standard protocol during bolus administration of intravenous contrast. Multiplanar CT image reconstructions and MIPs were obtained to evaluate the vascular anatomy. Carotid stenosis measurements (when applicable) are  obtained utilizing NASCET criteria, using the distal internal carotid diameter as the denominator. CONTRAST:  50 mL Isovue 370. COMPARISON:  CT head earlier today. FINDINGS: There is significant motion degradation on the exam. The intracranial study is of borderline diagnostic utility. CTA NECK Aortic arch: Standard branching. Imaged portion shows no evidence of aneurysm or dissection. No significant stenosis of the major arch vessel origins. Right carotid system: No significant calcification or soft plaque. No evidence of dissection, stenosis (50% or greater) or occlusion. Left carotid  system: No significant calcification or soft plaque. No evidence of dissection, stenosis (50% or greater) or occlusion. Vertebral arteries: Codominant. No evidence of dissection, stenosis (50% or greater) or occlusion. Nonvascular soft tissues: No neck masses. Shotty cervical lymph nodes. Mild spondylosis. Parenchymal interstitial prominence, in conjunction with bulky hilar and mediastinal adenopathy suggests pulmonary sarcoidosis. CTA HEAD Anterior circulation: No proximal large vessel occlusion. No significant stenosis, proximal occlusion, aneurysm, or vascular malformation. Posterior circulation: Both vertebrals contribute to basilar formation. No evidence for basilar thrombosis. No significant stenosis, proximal occlusion, aneurysm, or vascular malformation. Venous sinuses: As permitted by contrast timing, patent. Anatomic variants: None of significance. Delayed phase:   No abnormal intracranial enhancement. The abnormality in the RIGHT thalamus is less impressive on today's CTA study. Post infusion imaging, less motion degraded, (image 15 series 501) suggests the RIGHT thalamic abnormality may be linear calcifications or a developmental venous anomaly. With regard to the question of BILATERAL thalamic hemorrhages, there is no evidence for thrombosis of internal cerebral veins, vein of Galen, or other visualized deep venous system structure. With regard to the LEFT thalamic hemorrhage, overall size is stable. IMPRESSION: Significant motion degradation. Intracranial component of the examination is of borderline diagnostic utility. Stable LEFT thalamic hemorrhage, without progression. No intracranial vascular malformation or deep venous thrombosis. RIGHT thalamic abnormality less impressive on CTA. Query developmental venous anomaly accounting for linear hyperdensity, versus calcifications. If MRI brain is performed, consider formal MR venogram postcontrast to further assess. No extracranial or intracranial  stenosis or dissection. Parenchymal interstitial prominence, in conjunction with bulky adenopathy, suggesting sarcoidosis. Consider chest CT with contrast for further evaluation. Electronically Signed   By: Elsie Stain M.D.   On: 04/17/2016 10:21   Ct Angio Neck W Or Wo Contrast  Result Date: 04/17/2016 CLINICAL DATA:  Subsequent evaluation of intracranial hemorrhage. Acute onset of RIGHT-sided weakness. History of hypertension with noncompliance. EXAM: CT ANGIOGRAPHY HEAD AND NECK TECHNIQUE: Multidetector CT imaging of the head and neck was performed using the standard protocol during bolus administration of intravenous contrast. Multiplanar CT image reconstructions and MIPs were obtained to evaluate the vascular anatomy. Carotid stenosis measurements (when applicable) are obtained utilizing NASCET criteria, using the distal internal carotid diameter as the denominator. CONTRAST:  50 mL Isovue 370. COMPARISON:  CT head earlier today. FINDINGS: There is significant motion degradation on the exam. The intracranial study is of borderline diagnostic utility. CTA NECK Aortic arch: Standard branching. Imaged portion shows no evidence of aneurysm or dissection. No significant stenosis of the major arch vessel origins. Right carotid system: No significant calcification or soft plaque. No evidence of dissection, stenosis (50% or greater) or occlusion. Left carotid system: No significant calcification or soft plaque. No evidence of dissection, stenosis (50% or greater) or occlusion. Vertebral arteries: Codominant. No evidence of dissection, stenosis (50% or greater) or occlusion. Nonvascular soft tissues: No neck masses. Shotty cervical lymph nodes. Mild spondylosis. Parenchymal interstitial prominence, in conjunction with bulky hilar and mediastinal adenopathy suggests pulmonary sarcoidosis.  CTA HEAD Anterior circulation: No proximal large vessel occlusion. No significant stenosis, proximal occlusion, aneurysm, or  vascular malformation. Posterior circulation: Both vertebrals contribute to basilar formation. No evidence for basilar thrombosis. No significant stenosis, proximal occlusion, aneurysm, or vascular malformation. Venous sinuses: As permitted by contrast timing, patent. Anatomic variants: None of significance. Delayed phase:   No abnormal intracranial enhancement. The abnormality in the RIGHT thalamus is less impressive on today's CTA study. Post infusion imaging, less motion degraded, (image 15 series 501) suggests the RIGHT thalamic abnormality may be linear calcifications or a developmental venous anomaly. With regard to the question of BILATERAL thalamic hemorrhages, there is no evidence for thrombosis of internal cerebral veins, vein of Galen, or other visualized deep venous system structure. With regard to the LEFT thalamic hemorrhage, overall size is stable. IMPRESSION: Significant motion degradation. Intracranial component of the examination is of borderline diagnostic utility. Stable LEFT thalamic hemorrhage, without progression. No intracranial vascular malformation or deep venous thrombosis. RIGHT thalamic abnormality less impressive on CTA. Query developmental venous anomaly accounting for linear hyperdensity, versus calcifications. If MRI brain is performed, consider formal MR venogram postcontrast to further assess. No extracranial or intracranial stenosis or dissection. Parenchymal interstitial prominence, in conjunction with bulky adenopathy, suggesting sarcoidosis. Consider chest CT with contrast for further evaluation. Electronically Signed   By: Elsie StainJohn T Curnes M.D.   On: 04/17/2016 10:21   Ct Head Code Stroke Wo Contrast`  Result Date: 04/17/2016 CLINICAL DATA:  Code stroke. Initial evaluation for acute right-sided weakness. EXAM: CT HEAD WITHOUT CONTRAST TECHNIQUE: Contiguous axial images were obtained from the base of the skull through the vertex without intravenous contrast. COMPARISON:   None. FINDINGS: Brain: Acute intraparenchymal hemorrhage centered at the left thalamus measures 21 x 16 x 19 mm (estimated volume 3.2 cc). Minimal localized edema without significant mass effect. No breakthrough into the adjacent left lateral or third ventricles. No significant midline shift. There is an additional faint somewhat linear hemorrhage within the right thalamus (series 2, image 14). Hemorrhage measures approximately 16 x 3 x 5 mm. No associated mass effect. No intraventricular extension. No other acute intracranial hemorrhage. No other evidence for acute large vessel territory infarct. Mild atrophy with chronic small vessel ischemic disease. No mass lesion. No hydrocephalus. No extra-axial fluid collection. Vascular: No hyperdense vessel. Skull: Scalp soft tissues within normal limits.  Calvarium intact. Sinuses/Orbits: Globes and orbital soft tissues within normal limits. Visualized paranasal sinuses and mastoids are clear. IMPRESSION: 1. 21 x 16 x 19 mm left thalamic hemorrhage (estimated volume 3.2 cc). Mild localized edema without significant mass effect. No intraventricular extension. 2. Smaller 16 x 3 x 5 mm acute right thalamic hemorrhage without significant mass effect or intraventricular extension. Hypertensive etiology is suspected. 3. Mild atrophy with chronic microvascular ischemic disease. Critical Value/emergent results were called by telephone at the time of interpretation on 04/17/2016 at 3:55 am to Dr. Glynn OctaveSTEPHEN RANCOUR , who verbally acknowledged these results. Electronically Signed   By: Rise MuBenjamin  McClintock M.D.   On: 04/17/2016 03:58    PHYSICAL EXAM Vitals:   04/17/16 1145 04/17/16 1200 04/17/16 1215 04/17/16 1230  BP: (!) 157/84 (!) 151/84 (!) 153/84 (!) 152/82  Pulse: 91 90 86 88  Resp: 15 11 11 14   Temp:  98.7 F (37.1 C)    TempSrc:  Oral    SpO2: 95% 96% 93% 96%  Weight:      Height:       General: Vital signs reviewed.  Patient is well-developed and  well-nourished, in no acute distress and cooperative with exam.  Head: Normocephalic and atraumatic. Eyes: EOMI, conjunctivae normal, PERRLA.  Neck: Supple, trachea midline Cardiovascular: RRR Pulmonary/Chest: Clear to anterior auscultation bilaterally Abdominal: Soft, non-tender, non-distended, BS + Extremities: No lower extremity edema bilaterally Neurological: A&O x3, Naming intact, repetition intact. Some confusion and difficultly following 2 step commands. He is able to spell WORLD forwards and backwards, but with difficulty. Unable to subtract 35 cents from 1 dollar. Finger to nose intact. Strength is normal and symmetric bilaterally, face symmetric, normal tongue extension, PEERLA, EOMI, visual fields intact, no focal motor deficit, sensory intact to light touch bilaterally.   ASSESSMENT/PLAN [name] is a 45 y.o. male with history of HTN and T2DM presenting with confusion and found to have bilateral right and left sided thalamic hemorrhages. He did not receive IV t-PA due to hemorrhage.   Bilateral Acute Thalamic Hemorrhagic Stroke: Likely secondary to uncontrolled hypertension  Resultant mild confusion in computing and following commands  CT head: 21 x 16 x 19 mm left thalamic hemorrhage (estimated volume 3.2 cc). Mild localized edema without significant mass effect. No intraventricular extension. Smaller 16 x 3 x 5 mm acute right thalamic hemorrhage without significant mass effect or intraventricular extension. Hypertensive etiology is suspected. Mild atrophy with chronic microvascular ischemic disease.  CTA head and neck:  Stable LEFT thalamic hemorrhage, without progression. No intracranial vascular malformation or deep venous thrombosis. RIGHT thalamic abnormality less impressive on CTA. Query developmental venous anomaly accounting for linear hyperdensity, versus calcifications. If MRI brain is performed, consider formal MR venogram postcontrast to further assess. No  extracranial or intracranial stenosis or dissection.   Consider MRI with postcontrast venogram   2D Echo  pending  Transcranial Doppler pending  LDL 120  HgbA1c pending  SCDs for VTE prophylaxis- can start Lovenox SQ QD when appropriate Diet heart healthy/carb modified Room service appropriate? Yes; Fluid consistency: Thin  No antithrombotic prior to admission, now on No antithrombotic due to hemorrhage  Ongoing aggressive stroke risk factor management  Therapy recommendations:  pending  Disposition:  pending  Hypertension  Stable in 150s/80s, but requiring large doses of antihypertensives  Patient was maximized on Cardene, not switched to Cleviprex  Added labetalol 200 mg TID and amlodipine 10 mg daily  Long-term BP goal normotensive  Hyperlipidemia  Home meds:  None  LDL 120, goal < 70  Add atorvastatin 40 mg QD  Continue statin at discharge  Diabetes  HgbA1c pending, goal < 7.0  On SSI-M ACHS  Other Stroke Risk Factors  Obesity, Body mass index is 31.48 kg/m., recommend weight loss, diet and exercise as appropriate   Other Active Problems  Parenchymal interstitial prominence, in conjunction with bulky adenopathy, suggesting sarcoidosis. Consider chest CT with contrast  Hospital day # 0  Karlene LinemanAlexa Cherylin Waguespack, DO PGY-3 Internal Medicine Resident Pager # [phone] 04/17/2016 12:51 PM   To contact Stroke Continuity provider, please refer to [email or web address]. After hours, contact General Neurology

## 2016-04-17 NOTE — Progress Notes (Signed)
Wilkie AyeKristy RN called and said EMS in [place] with code stroke  3:19am  Beeper   332 In CT   334  Out of CT  336 [facility]   336 Radiologist  338

## 2016-04-17 NOTE — Progress Notes (Signed)
TCD technician came to notify this RN that the patient's TCD (which was currently in process) was cancelled.  As she was explaining this, the nurse secretary witnessed the patient fall on camera from the right side of his bed to the floor, as the side rail was still down and TCD machine was still in place on the right side.  Pt has new abrasion to the right shoulder from contact with the TCD machine as he fell.  Dr. Roda ShuttersXu with Neurology notified. No changes noted, full NIH done and documented.    Pt reported that he was attempting to reach his lunch tray just prior to the fall, and when he pushed down on the bed to sit up, his right hand slipped off of the right side of the bed.  No new complaints of pain.  Pt reports he does not want to notify family.        04/17/16 1345  What Happened  Was fall witnessed? Yes  Who witnessed fall? On camera - nurse secretary  Patients activity before fall other (comment) (reaching for lunch tray)  Point of contact hip/leg (Right)  Was patient injured? No  Follow Up  MD notified Roda ShuttersXu  Time MD notified [phone]  Family notified No- patient refusal  Additional tests No  Simple treatment (n/a)  Progress note created (see row info) Yes  Adult Fall Risk Assessment  Risk Factor Category (scoring not indicated) Fall has occurred during this admission (document High fall risk)  Age 45  Fall History: Fall within 6 months prior to admission 5  Elimination; Bowel and/or Urine Incontinence 0  Elimination; Bowel and/or Urine Urgency/Frequency 0  Medications: includes PCA/Opiates, Anti-convulsants, Anti-hypertensives, Diuretics, Hypnotics, Laxatives, Sedatives, and Psychotropics 5  Patient Care Equipment 3  Mobility-Assistance 2  Mobility-Gait 2  Mobility-Sensory Deficit 0  Altered awareness of immediate physical environment 0  Impulsiveness 0  Lack of understanding of one's physical/cognitive limitations 0  Total Score 17  Patient's Fall Risk High Fall Risk (>13  points)  Adult Fall Risk Interventions  Required Bundle Interventions *See Row Information* High fall risk - low, moderate, and high requirements implemented  Additional Interventions Camera surveillance (with patient/family notification & education);PT/OT need assessed if change in mobility from baseline  Screening for Fall Injury Risk  Risk For Fall Injury- See Row Information  Nurse judgement;D  Injury Prevention Interventions Camera surveillance (with patient/family notification & education)  Vitals  BP (!) 143/76  MAP (mmHg) 93  Pulse Rate 80  ECG Heart Rate 80  Resp 19  Oxygen Therapy  SpO2 90 %  Pain Assessment  Pain Assessment No/denies pain  Neurological  Neuro (WDL) X  Level of Consciousness Alert  Orientation Level Oriented X4  Cognition Follows commands  Speech Clear  Pupil Assessment  Yes  R Pupil Size (mm) 3  R Pupil Shape Round  R Pupil Reaction Brisk  L Pupil Size (mm) 3  L Pupil Shape Round  L Pupil Reaction Brisk  Additional Pupil Assessments No  Motor Function/Sensation Assessment Motor response;Motor strength;Sensation  RUE Motor Response Purposeful movement;Responds to commands  RUE Sensation Full sensation  RUE Motor Strength 5  LUE Motor Response Purposeful movement;Responds to commands  LUE Sensation Full sensation  LUE Motor Strength 5  RLE Motor Response Purposeful movement;Responds to commands  RLE Sensation Full sensation  RLE Motor Strength 5  LLE Motor Response Purposeful movement;Responds to commands  LLE Sensation Full sensation  LLE Motor Strength 5  Glasgow Coma Scale  Eye  Opening 4  Best Verbal Response (NON-intubated) 5  Best Motor Response 6  Glasgow Coma Scale Score 15  NIH Stroke Scale ( + Modified Stroke Scale Criteria)   Interval Other (Comment) (post fall)  Level of Consciousness (1a.)    0  LOC Questions (1b. )   + 0  LOC Commands (1c. )   +  0  Best Gaze (2. )  + 0  Visual (3. )  + 0  Facial Palsy (4. )     0  Motor  Arm, Left (5a. )   + 0  Motor Arm, Right (5b. )   + 0  Motor Leg, Left (6a. )   + 0  Motor Leg, Right (6b. )   + 0  Limb Ataxia (7. ) 0  Sensory (8. )   + 0  Best Language (9. )   + 0  Dysarthria (10. ) 0  Extinction/Inattention (11.)   + 0  Total 0  Musculoskeletal  Musculoskeletal (WDL) WDL  Assistive Device None  Integumentary  Integumentary (WDL) X  RN Assisting with Skin Assessment on Admission Ladene ArtistJoanna Lindsey  Skin Color Appropriate for ethnicity  Skin Condition Dry  Skin Integrity Abrasion  Abrasion Location Shoulder  Abrasion Location Orientation Right  Abrasion Intervention Other (Comment) (assessed)  Skin Turgor Non-tenting

## 2016-04-17 NOTE — ED Triage Notes (Signed)
Pt was leaving for work and felt confused. Wife states he was normal at 0220 and became confused around 0302. Per ems pt seems to be weak on the right side, slurred speech at times, and unsteady gait.

## 2016-04-17 NOTE — Progress Notes (Signed)
PT Cancellation Note  Patient Details Name: Danny OrmondBobby Stevens MRN: [id number] DOB: 12/19/1970   Cancelled Treatment:    Reason Eval/Treat Not Completed: Patient not medically ready Pt on bedrest. Will await increase in activity orders prior to initiation of PT eval.   Blake DivineShauna A Milianna Ericsson 04/17/2016, 8:48 AM Mylo RedShauna [name], PT, DPT [phone]

## 2016-04-18 DIAGNOSIS — I612 Nontraumatic intracerebral hemorrhage in hemisphere, unspecified: Secondary | ICD-10-CM

## 2016-04-18 LAB — BASIC METABOLIC PANEL
Anion gap: 10 (ref 5–15)
BUN: 12 mg/dL (ref 6–20)
CHLORIDE: 101 mmol/L (ref 101–111)
CO2: 27 mmol/L (ref 22–32)
CREATININE: 1.26 mg/dL — AB (ref 0.61–1.24)
Calcium: 8.6 mg/dL — ABNORMAL LOW (ref 8.9–10.3)
GFR calc non Af Amer: >60 mL/min (ref >60)
GLUCOSE: 200 mg/dL — AB (ref 65–99)
Potassium: 3.1 mmol/L — ABNORMAL LOW (ref 3.5–5.1)
Sodium: 138 mmol/L (ref 135–145)

## 2016-04-18 LAB — CBC
HCT: 37.6 % — ABNORMAL LOW (ref 39.0–52.0)
HEMOGLOBIN: 12.6 g/dL — AB (ref 13.0–17.0)
MCH: 28.6 pg (ref 26.0–34.0)
MCHC: 33.5 g/dL (ref 30.0–36.0)
MCV: 85.5 fL (ref 78.0–100.0)
Platelets: 219 10*3/uL (ref 150–400)
RBC: 4.4 MIL/uL (ref 4.22–5.81)
RDW: 13.3 % (ref 11.5–15.5)
WBC: 5.9 10*3/uL (ref 4.0–10.5)

## 2016-04-18 LAB — GLUCOSE, CAPILLARY
GLUCOSE-CAPILLARY: 193 mg/dL — AB (ref 65–99)
Glucose-Capillary: 233 mg/dL — ABNORMAL HIGH (ref 65–99)
Glucose-Capillary: 227 mg/dL — ABNORMAL HIGH (ref 65–99)
Glucose-Capillary: 195 mg/dL — ABNORMAL HIGH (ref 65–99)

## 2016-04-18 LAB — HEMOGLOBIN A1C
Hgb A1c MFr Bld: 8.7 % — ABNORMAL HIGH (ref 4.8–5.6)
Mean Plasma Glucose: 203 mg/dL

## 2016-04-18 MED ORDER — SODIUM CHLORIDE 0.9 % IV SOLN
30.0000 meq | Freq: Once | INTRAVENOUS | Status: AC
  Administered 2016-04-18: 30 meq via INTRAVENOUS
  Filled 2016-04-18: qty 15

## 2016-04-18 NOTE — Progress Notes (Signed)
STROKE TEAM PROGRESS NOTE   HISTORY OF PRESENT ILLNESS (per record) Danny OrmondBobby Stevens is an 45 y.o. male with a history of diabetes mellitus and hypertension and noncompliance with treatment who presented to the ED at [facility] acute onset of confusion, speech change and right-sided weakness. Code stroke was activated. Blood pressure was markedly elevated. CT scan of his head showed an acute left thalamic hemorrhage, as well as a smaller acute right thalamic hemorrhage. There was no ventricular extension of hemorrhage. Minimal localized edema was noted on the left. Blood pressure was 233/139 on admission to the ED. He was given labetalol and subsequently started on Cardene drip. He was transferred to [facility] for further management. NIH stroke score was 2 time of this evaluation.  Patient was not administered IV t-PA secondary to ICH. He was admitted to the neuro ICU for further evaluation and treatment.   SUBJECTIVE (INTERVAL HISTORY) Patient was seen at the bedside without family. Overall he feels his condition is stable, he is improved.    OBJECTIVE Temp:  [97.5 F (36.4 C)-98.7 F (37.1 C)] 97.5 F (36.4 C) (12/23 0342) Pulse Rate:  [78-110] 87 (12/23 0700) Cardiac Rhythm: Normal sinus rhythm (12/23 0000) Resp:  [10-27] 10 (12/23 0700) BP: (121-177)/(74-98) 158/91 (12/23 0700) SpO2:  [88 %-97 %] 94 % (12/23 0700)  CBC:   Recent Labs Lab 04/17/16 0345 04/17/16 0357 04/18/16 0328  WBC 6.1  --  5.9  NEUTROABS 3.9  --   --   HGB 14.1 14.3 12.6*  HCT 42.7 42.0 37.6*  MCV 88.0  --  85.5  PLT 234  --  219    Basic Metabolic Panel:   Recent Labs Lab 04/17/16 0345 04/17/16 0357 04/18/16 0328  NA 139 142 138  K 3.1* 3.2* 3.1*  CL 102 100* 101  CO2 30  --  27  GLUCOSE 200* 196* 200*  BUN 16 16 12   CREATININE 1.38* 1.30* 1.26*  CALCIUM 9.0  --  8.6*    Lipid Panel:     Component Value Date/Time   CHOL 198 04/17/2016 1012   TRIG 91 04/17/2016 1012   HDL 60  04/17/2016 1012   CHOLHDL 3.3 04/17/2016 1012   VLDL 18 04/17/2016 1012   LDLCALC 120 (H) 04/17/2016 1012    Urine Drug Screen:     Component Value Date/Time   LABOPIA NONE DETECTED 04/17/2016 0420   COCAINSCRNUR NONE DETECTED 04/17/2016 0420   LABBENZ NONE DETECTED 04/17/2016 0420   AMPHETMU NONE DETECTED 04/17/2016 0420   THCU NONE DETECTED 04/17/2016 0420   LABBARB NONE DETECTED 04/17/2016 0420     IMAGING  Ct Angio Head and Neck W Or Wo Contrast 04/17/2016 Significant motion degradation. Intracranial component of the examination is of borderline diagnostic utility.  Stable LEFT thalamic hemorrhage, without progression.  No intracranial vascular malformation or deep venous thrombosis.  RIGHT thalamic abnormality less impressive on CTA.  Query developmental venous anomaly accounting for linear hyperdensity, versus calcifications. If MRI brain is performed, consider formal MR venogram postcontrast to further assess. No extracranial or intracranial stenosis or dissection. Parenchymal interstitial prominence, in conjunction with bulky adenopathy, suggesting sarcoidosis.  Consider chest CT with contrast for further evaluation.     Ct Head Wo Contrast 04/17/2016 1. Unchanged appearance of the left posterior thalamic focus of acute intraparenchymal hemorrhage, most likely of a hypertensive etiology. No new midline shift or mass effect. No intraventricular extension of blood or hydrocephalus.  2. Unchanged linear hyperdensity within the posterior right  thalamus, possibly an area of mineralization or a prominent vein, such as a developmental venous anomaly.  3. No new areas of hemorrhage.    Ct Head Code Stroke Wo Contrast` 04/17/2016 1. 21 x 16 x 19 mm left thalamic hemorrhage (estimated volume 3.2 cc). Mild localized edema without significant mass effect. No intraventricular extension.  2. Smaller 16 x 3 x 5 mm acute right thalamic hemorrhage without significant mass effect or  intraventricular extension. Hypertensive etiology is suspected.  3. Mild atrophy with chronic microvascular ischemic disease.      PHYSICAL EXAM Vitals:   04/18/16 0500 04/18/16 0546 04/18/16 0600 04/18/16 0700  BP:  (!) 142/86 (!) 149/88 (!) 158/91  Pulse: 80 86 87 87  Resp: 15 13 13 10   Temp:      TempSrc:      SpO2: 96% 94% 94% 94%  Weight:      Height:       No changes in exam today, he has a non-focal neurologic exam  General: Vital signs reviewed.  Patient is well-developed and well-nourished, in no acute distress and cooperative with exam.  Head: Normocephalic and atraumatic. Eyes: EOMI, conjunctivae normal, PERRLA.  Neck: Supple, trachea midline Cardiovascular: RRR Pulmonary/Chest: Clear to anterior auscultation bilaterally Abdominal: Soft, non-tender, non-distended, BS + Extremities: No lower extremity edema bilaterally Neurological: A&O x3, Naming intact, repetition intact. Follows simple and 2-step commands. Finger to nose intact. Strength is normal and symmetric bilaterally, face symmetric, normal tongue extension, PEERLA, EOMI, visual fields intact, no focal motor deficit, sensory intact to light touch bilaterally.    ASSESSMENT/PLAN [name] is a 45 y.o. male with history of HTN and T2DM presenting with confusion and found to have bilateral right and left sided thalamic hemorrhages. He did not receive IV t-PA due to hemorrhage.   Bilateral Acute Thalamic Hemorrhagic Stroke: Likely secondary to uncontrolled hypertension due to noncompliance  Resultant mild confusion in computing and following commands  CT head: 21 x 16 x 19 mm left thalamic hemorrhage (estimated volume 3.2 cc). Mild localized edema without significant mass effect. No intraventricular extension. Smaller 16 x 3 x 5 mm acute right thalamic hemorrhage without significant mass effect or intraventricular extension. Hypertensive etiology is suspected. Mild atrophy with chronic microvascular  ischemic disease.  CTA head and neck:  Stable LEFT thalamic hemorrhage, without progression. No intracranial vascular malformation or deep venous thrombosis. RIGHT thalamic abnormality less impressive on CTA. Query developmental venous anomaly accounting for linear hyperdensity, versus calcifications. If MRI brain is performed, consider formal MR venogram postcontrast to further assess. No extracranial or intracranial stenosis or dissection.   Consider MRI with postcontrast venogram   2D Echo  pending  LDL 120  HgbA1c - 8.7  SCDs for VTE prophylaxis- can start Lovenox SQ QD when appropriate Diet heart healthy/carb modified Room service appropriate? Yes; Fluid consistency: Thin  No antithrombotic prior to admission, now on No antithrombotic due to hemorrhage  Ongoing aggressive stroke risk factor management  Therapy recommendations: Outpatient PT recommended. OT evaluation pending.  Disposition:  pending  Hypertension  Stable in 150s/80s, but requiring large doses of antihypertensives  Patient was maximized on Cardene, not switched to Cleviprex  Added labetalol 200 mg TID and amlodipine 10 mg daily  Long-term BP goal normotensive  Hyperlipidemia  Home meds:  None  LDL 120, goal < 70  Add atorvastatin 40 mg QD at time of discharge   Diabetes  HgbA1c 8.7, goal < 7.0  On SSI-M ACHS  Poorly controlled  prior to admission    Other Stroke Risk Factors  Obesity, Body mass index is 31.48 kg/m., recommend weight loss, diet and exercise as appropriate     Other Active Problems  Parenchymal interstitial prominence, in conjunction with bulky adenopathy, suggesting sarcoidosis.    Consider chest CT  with contrast  Mild Anemia - 12.6 / 37.6  Creatinine - 1.38 -> 1.30 -> 1.26  Hypokalemia - 3.1 -> supplement -> recheck   Hospital day # 1   Personally examined patient and images, and have participated in and made any corrections needed to history, physical,  neuro exam,assessment and plan as stated above.  I have personally obtained the history, evaluated lab date, reviewed imaging studies and agree with radiology interpretations. Hypertensive bleeds due to non-compliance. His exam is non-focal.    Naomie DeanAntonia Nikolaos Maddocks, MD   To contact Stroke Continuity provider, please refer to [email or web address]. After hours, contact General Neurology

## 2016-04-18 NOTE — Progress Notes (Addendum)
Physical Therapy Treatment Patient Details Name: Danny OrmondBobby Stevens MRN: [id number] DOB: 09-Aug-1970 Today's Date: 04/18/2016    History of Present Illness Patient is a 45 y/o male with hx of DM, HTN, noncompliance with treatment who presents to the ED at [facility] with acute onset of confusion, speech change and right-sided weakness. NIH 2. CT- acute left thalamic infarction, as well as a smaller acute right thalamic infarction    PT Comments    Pt continues with poor attention on R side vs true visual changes.  Even when discussing deficits pt seems not too aware of implications.  While ambulating allowed pt to run into objects and door frames on R side and questioned pt about this and he did admit he doesn't normally do this, but denied that it was a problem.  Strongly emphasized need for pt not to drive at this time and pt simply said "ok".  Prior to ambulation pt indicated needing to use bathroom and began to urinate on floor while walking to bathroom.  When questioning pt about this he states he doesn't normally have trouble with incontinence, and simply states he "had to go".  Pt will need family support at home to ensure safety.  Will continue to follow.    Follow Up Recommendations  Outpatient PT;Supervision - Intermittent and Outpatient OT     Equipment Recommendations  None recommended by PT    Recommendations for Other Services       Precautions / Restrictions Precautions Precautions: Fall Precaution Comments: R visual deficits and R decreased attention Restrictions Weight Bearing Restrictions: No    Mobility  Bed Mobility Overal bed mobility: Modified Independent                Transfers Overall transfer level: Needs assistance Equipment used: None Transfers: Sit to/from Stand Sit to Stand: Supervision            Ambulation/Gait Ambulation/Gait assistance: Min guard Ambulation Distance (Feet): 300 Feet Assistive device: None Gait  Pattern/deviations: Step-through pattern;Decreased stride length;Staggering right     General Gait Details: pt repeatedly running into objects and door frame on R side.  pt will corrects once he hits something, but not attend to R side to avoid obstacles.  pt able to perform head turns, changes in speed, and negotiate over and around obstacles without balance deficits.     Stairs            Wheelchair Mobility    Modified Rankin (Stroke Patients Only) Modified Rankin (Stroke Patients Only) Pre-Morbid Rankin Score: No symptoms Modified Rankin: Moderately severe disability     Balance Overall balance assessment: Needs assistance Sitting-balance support: No upper extremity supported;Feet supported Sitting balance-Leahy Scale: Good     Standing balance support: During functional activity Standing balance-Leahy Scale: Good                      Cognition Arousal/Alertness: Awake/alert Behavior During Therapy: WFL for tasks assessed/performed Overall Cognitive Status: Impaired/Different from baseline Area of Impairment: Attention;Safety/judgement;Awareness;Problem solving   Current Attention Level: Selective     Safety/Judgement: Decreased awareness of deficits;Decreased awareness of safety Awareness: Emergent Problem Solving: Requires verbal cues;Difficulty sequencing General Comments: pt with decreased attention to R side, especially during mobility or multitasking.  When stationary pt is able to find objects on R with increased time and will turn and look to R side.  pt with poor awareness of safety and deficits, question follow through at home.  Exercises      General Comments        Pertinent Vitals/Pain Pain Assessment: No/denies pain    Home Living                      Prior Function            PT Goals (current goals can now be found in the care plan section) Acute Rehab PT Goals Patient Stated Goal: to go home for xmas PT Goal  Formulation: With patient Time For Goal Achievement: 05/01/16 Potential to Achieve Goals: Good Progress towards PT goals: Progressing toward goals    Frequency    Min 4X/week      PT Plan Current plan remains appropriate    Co-evaluation             End of Session Equipment Utilized During Treatment: Gait belt Activity Tolerance: Patient tolerated treatment well Patient left: in chair;with call bell/phone within reach;with chair alarm set     Time: [id number] PT Time Calculation (min) (ACUTE ONLY): 24 min  Charges:  $Gait Training: 23-37 mins                    G CodesAlison Murray:      Naava Janeway F Alphonzo Devera, [place]  [phone] 04/18/2016, 2:00 PM

## 2016-04-18 NOTE — Progress Notes (Signed)
Patient with apahsia and ataxia that is worse than beginning of shift. Dr. Roseanne RenoStewart paged and notified. STAT head CT ordered.

## 2016-04-19 ENCOUNTER — Other Ambulatory Visit (HOSPITAL_COMMUNITY): Payer: 59

## 2016-04-19 ENCOUNTER — Encounter (HOSPITAL_COMMUNITY): Payer: Self-pay | Admitting: *Deleted

## 2016-04-19 ENCOUNTER — Inpatient Hospital Stay (HOSPITAL_COMMUNITY): Payer: 59

## 2016-04-19 LAB — GLUCOSE, CAPILLARY
GLUCOSE-CAPILLARY: 128 mg/dL — AB (ref 65–99)
Glucose-Capillary: 182 mg/dL — ABNORMAL HIGH (ref 65–99)
Glucose-Capillary: 254 mg/dL — ABNORMAL HIGH (ref 65–99)
Glucose-Capillary: 176 mg/dL — ABNORMAL HIGH (ref 65–99)

## 2016-04-19 LAB — BASIC METABOLIC PANEL
Anion gap: 9 (ref 5–15)
BUN: 14 mg/dL (ref 6–20)
CALCIUM: 8.8 mg/dL — AB (ref 8.9–10.3)
CO2: 25 mmol/L (ref 22–32)
CREATININE: 1.16 mg/dL (ref 0.61–1.24)
Chloride: 103 mmol/L (ref 101–111)
GFR calc Af Amer: >60 mL/min (ref >60)
GLUCOSE: 206 mg/dL — AB (ref 65–99)
Potassium: 3.4 mmol/L — ABNORMAL LOW (ref 3.5–5.1)
SODIUM: 137 mmol/L (ref 135–145)

## 2016-04-19 MED ORDER — IOPAMIDOL (ISOVUE-300) INJECTION 61%
INTRAVENOUS | Status: AC
  Administered 2016-04-19: 75 mL
  Filled 2016-04-19: qty 75

## 2016-04-19 MED ORDER — LABETALOL HCL 200 MG PO TABS
400.0000 mg | ORAL_TABLET | Freq: Two times a day (BID) | ORAL | Status: DC
  Administered 2016-04-19 – 2016-04-21 (×4): 400 mg via ORAL
  Filled 2016-04-19 (×4): qty 2

## 2016-04-19 MED ORDER — LISINOPRIL 10 MG PO TABS
10.0000 mg | ORAL_TABLET | Freq: Every day | ORAL | Status: DC
  Administered 2016-04-19 – 2016-04-20 (×2): 10 mg via ORAL
  Filled 2016-04-19 (×2): qty 1

## 2016-04-19 NOTE — Progress Notes (Signed)
Patient ambulated in hallway with standby assist of RN approximately 100 yards. Continue to monitor patient.

## 2016-04-19 NOTE — Progress Notes (Signed)
STROKE TEAM PROGRESS NOTE   HISTORY OF PRESENT ILLNESS (per record) Danny Stevens is an 45 y.o. male with a history of diabetes mellitus and hypertension and noncompliance with treatment who presented to the ED at [facility] acute onset of confusion, speech change and right-sided weakness. Code stroke was activated. Blood pressure was markedly elevated. CT scan of his head showed an acute left thalamic hemorrhage, as well as a smaller acute right thalamic hemorrhage. There was no ventricular extension of hemorrhage. Minimal localized edema was noted on the left. Blood pressure was 233/139 on admission to the ED. He was given labetalol and subsequently started on Cardene drip. He was transferred to [facility] for further management. NIH stroke score was 2 time of this evaluation.  Patient was not administered IV t-PA secondary to ICH. He was admitted to the neuro ICU for further evaluation and treatment.   SUBJECTIVE (INTERVAL HISTORY) Patient was seen at the bedside without family. Overall he feels his condition is stable, he is improved. He is fine, waiting to be transferred out of the ICU, still on Cleviprex. and we will adjust his PO meds. Also we will order CT of the chest due to concerns for Sarcoidosis.   OBJECTIVE Temp:  [98.3 F (36.8 C)-98.8 F (37.1 C)] 98.6 F (37 C) (12/24 0800) Pulse Rate:  [87-107] 107 (12/24 0830) Cardiac Rhythm: Normal sinus rhythm (12/24 0800) Resp:  [12-26] 19 (12/24 0830) BP: (123-182)/(74-142) 177/104 (12/24 0830) SpO2:  [86 %-100 %] 97 % (12/24 0830)  CBC:   Recent Labs Lab 04/17/16 0345 04/17/16 0357 04/18/16 0328  WBC 6.1  --  5.9  NEUTROABS 3.9  --   --   HGB 14.1 14.3 12.6*  HCT 42.7 42.0 37.6*  MCV 88.0  --  85.5  PLT 234  --  219    Basic Metabolic Panel:   Recent Labs Lab 04/18/16 0328 04/19/16 0326  NA 138 137  K 3.1* 3.4*  CL 101 103  CO2 27 25  GLUCOSE 200* 206*  BUN 12 14  CREATININE 1.26* 1.16  CALCIUM 8.6* 8.8*     Lipid Panel:     Component Value Date/Time   CHOL 198 04/17/2016 1012   TRIG 91 04/17/2016 1012   HDL 60 04/17/2016 1012   CHOLHDL 3.3 04/17/2016 1012   VLDL 18 04/17/2016 1012   LDLCALC 120 (H) 04/17/2016 1012    Urine Drug Screen:     Component Value Date/Time   LABOPIA NONE DETECTED 04/17/2016 0420   COCAINSCRNUR NONE DETECTED 04/17/2016 0420   LABBENZ NONE DETECTED 04/17/2016 0420   AMPHETMU NONE DETECTED 04/17/2016 0420   THCU NONE DETECTED 04/17/2016 0420   LABBARB NONE DETECTED 04/17/2016 0420     IMAGING  Ct Angio Head and Neck W Or Wo Contrast 04/17/2016 Significant motion degradation. Intracranial component of the examination is of borderline diagnostic utility.  Stable LEFT thalamic hemorrhage, without progression.  No intracranial vascular malformation or deep venous thrombosis.  RIGHT thalamic abnormality less impressive on CTA.  Query developmental venous anomaly accounting for linear hyperdensity, versus calcifications. If MRI brain is performed, consider formal MR venogram postcontrast to further assess. No extracranial or intracranial stenosis or dissection. Parenchymal interstitial prominence, in conjunction with bulky adenopathy, suggesting sarcoidosis.  Consider chest CT with contrast for further evaluation.     Ct Head Wo Contrast 04/17/2016 1. Unchanged appearance of the left posterior thalamic focus of acute intraparenchymal hemorrhage, most likely of a hypertensive etiology. No new midline shift  or mass effect. No intraventricular extension of blood or hydrocephalus.  2. Unchanged linear hyperdensity within the posterior right thalamus, possibly an area of mineralization or a prominent vein, such as a developmental venous anomaly.  3. No new areas of hemorrhage.    Ct Head Code Stroke Wo Contrast` 04/17/2016 1. 21 x 16 x 19 mm left thalamic hemorrhage (estimated volume 3.2 cc). Mild localized edema without significant mass effect. No  intraventricular extension.  2. Smaller 16 x 3 x 5 mm acute right thalamic hemorrhage without significant mass effect or intraventricular extension. Hypertensive etiology is suspected.  3. Mild atrophy with chronic microvascular ischemic disease.      PHYSICAL EXAM Vitals:   04/19/16 0745 04/19/16 0800 04/19/16 0815 04/19/16 0830  BP: (!) 159/95 (!) 158/91 (!) 153/88 (!) 177/104  Pulse: 99 93 93 (!) 107  Resp: 17 14 14 19   Temp:  98.6 F (37 C)    TempSrc:  Oral    SpO2: 98% 99% 100% 97%  Weight:      Height:       No changes in exam today, he has a non-focal neurologic exam  General: Vital signs reviewed.  Patient is well-developed and well-nourished, in no acute distress and cooperative with exam.  Head: Normocephalic and atraumatic. Eyes: EOMI, conjunctivae normal, PERRLA.  Neck: Supple, trachea midline Cardiovascular: RRR Pulmonary/Chest: Clear to anterior auscultation bilaterally Abdominal: Soft, non-tender, non-distended, BS + Extremities: No lower extremity edema bilaterally Neurological: A&O x3, Naming intact, repetition intact. Follows simple and 2-step commands. Finger to nose intact. Strength is normal and symmetric bilaterally, face symmetric, normal tongue extension, PEERLA, EOMI, visual fields intact, no focal motor deficit, sensory intact to light touch bilaterally.    ASSESSMENT/PLAN [name] is a 45 y.o. male with history of HTN and T2DM presenting with confusion and found to have bilateral right and left sided thalamic hemorrhages. He did not receive IV t-PA due to hemorrhage.   Bilateral Acute Thalamic Hemorrhagic Stroke: Likely secondary to uncontrolled hypertension due to noncompliance  Resultant no deficits  CT head: 21 x 16 x 19 mm left thalamic hemorrhage (estimated volume 3.2 cc). Mild localized edema without significant mass effect. No intraventricular extension. Smaller 16 x 3 x 5 mm acute right thalamic hemorrhage without significant mass  effect or intraventricular extension. Hypertensive etiology is suspected. Mild atrophy with chronic microvascular ischemic disease.  CTA head and neck:  Stable LEFT thalamic hemorrhage, without progression. No intracranial vascular malformation or deep venous thrombosis. RIGHT thalamic abnormality less impressive on CTA. Query developmental venous anomaly accounting for linear hyperdensity, versus calcifications. If MRI brain is performed, consider formal MR venogram postcontrast to further assess. No extracranial or intracranial stenosis or dissection.   Consider MRI with postcontrast venogram   2D Echo  pending  LDL 120  HgbA1c - 8.7  SCDs for VTE prophylaxis- can start Lovenox SQ QD when appropriate Diet heart healthy/carb modified Room service appropriate? Yes; Fluid consistency: Thin  No antithrombotic prior to admission, now on No antithrombotic due to hemorrhage  Ongoing aggressive stroke risk factor management  Therapy recommendations: Outpatient PT recommended. OT evaluation pending.  Disposition:  pending   Hypertension  Stable in 150s/80s, but requiring large doses of antihypertensives  Patient was maximized on Cardene, not switched to Cleviprex  Added labetalol 200 mg TID and amlodipine 10 mg daily  Lisinopril 10 mg daily added 04/19/2016 (titrate up as needed - watch renal function)  Long-term BP goal normotensive SBP goal changed to >  180 mmHg. On 04/19/2016.   Hyperlipidemia  Home meds:  None  LDL 120, goal < 70  Add atorvastatin 40 mg QD at time of discharge   Diabetes  HgbA1c 8.7, goal < 7.0  On SSI-M ACHS  Poorly controlled prior to admission    Other Stroke Risk Factors  Obesity, Body mass index is 31.48 kg/m., recommend weight loss, diet and exercise as appropriate     Other Active Problems  Parenchymal interstitial prominence, in conjunction with bulky adenopathy, suggesting sarcoidosis.     Chest CT  with contrast ordered  04/19/2016 R/O sarcoidosis.  Mild Anemia - 12.6 / 37.6  Creatinine - 1.38 -> 1.30 -> 1.26  Hypokalemia - 3.1 -> supplement -> recheck -> 3.4  Recheck labs Tuesday. ACE level pending.  Transfer to floor when off drips.   Hospital day # 292  45 year old with bilateral thalamic hemorrhages due to non-compliance with HTN medications. Still on Cleviprex, will adjust oral PO meds, will change goal to < 180 SBP, CT of the chest for concerns of sarcoidosis.  Personally examined patient and images, and have participated in and made any corrections needed to history, physical, neuro exam,assessment and plan as stated above.  I have personally obtained the history, evaluated lab date, reviewed imaging studies and agree with radiology interpretations. Hypertensive bleeds due to non-compliance. His exam is non-focal.       To contact Stroke Continuity provider, please refer to [email or web address]. After hours, contact General Neurology

## 2016-04-20 DIAGNOSIS — I610 Nontraumatic intracerebral hemorrhage in hemisphere, subcortical: Secondary | ICD-10-CM

## 2016-04-20 LAB — GLUCOSE, CAPILLARY
GLUCOSE-CAPILLARY: 135 mg/dL — AB (ref 65–99)
GLUCOSE-CAPILLARY: 169 mg/dL — AB (ref 65–99)
Glucose-Capillary: 250 mg/dL — ABNORMAL HIGH (ref 65–99)
Glucose-Capillary: 108 mg/dL — ABNORMAL HIGH (ref 65–99)

## 2016-04-20 NOTE — Progress Notes (Signed)
STROKE TEAM PROGRESS NOTE   HISTORY OF PRESENT ILLNESS (per record) Danny ClicheBobby Tottenis an 45 y.o.malewith a history of diabetes mellitus and hypertension and noncompliance with treatment who presented to the ED at [facility] acute onset of confusion, speech change and right-sided weakness. Code stroke was activated. Blood pressure was markedly elevated. CT scan of his head showed an acute left thalamic hemorrhage, as well as a smaller acute right thalamic hemorrhage. There was no ventricular extension of hemorrhage. Minimal localized edema was noted on the left. Blood pressure was 233/139 on admission to the ED. He was given labetalol and subsequently started on Cardene drip. He was transferred to [facility] for further management. NIH stroke score was 2 time of this evaluation.  Patient was not administered IV t-PA secondary to ICH. He was admitted to the neuro ICU for further evaluation and treatment.   SUBJECTIVE (INTERVAL HISTORY) Patient without complaints.    OBJECTIVE Temp:  [98.3 F (36.8 C)-98.7 F (37.1 C)] 98.5 F (36.9 C) (12/25 0540) Pulse Rate:  [80-128] 90 (12/25 0540) Cardiac Rhythm: Normal sinus rhythm (12/25 0700) Resp:  [13-23] 18 (12/25 0540) BP: (133-189)/(90-111) 173/93 (12/25 0540) SpO2:  [92 %-100 %] 97 % (12/25 0540)  CBC:   Recent Labs Lab 04/17/16 0345 04/17/16 0357 04/18/16 0328  WBC 6.1  --  5.9  NEUTROABS 3.9  --   --   HGB 14.1 14.3 12.6*  HCT 42.7 42.0 37.6*  MCV 88.0  --  85.5  PLT 234  --  219    Basic Metabolic Panel:   Recent Labs Lab 04/18/16 0328 04/19/16 0326  NA 138 137  K 3.1* 3.4*  CL 101 103  CO2 27 25  GLUCOSE 200* 206*  BUN 12 14  CREATININE 1.26* 1.16  CALCIUM 8.6* 8.8*    Lipid Panel:     Component Value Date/Time   CHOL 198 04/17/2016 1012   TRIG 91 04/17/2016 1012   HDL 60 04/17/2016 1012   CHOLHDL 3.3 04/17/2016 1012   VLDL 18 04/17/2016 1012   LDLCALC 120 (H) 04/17/2016 1012    Urine Drug Screen:      Component Value Date/Time   LABOPIA NONE DETECTED 04/17/2016 0420   COCAINSCRNUR NONE DETECTED 04/17/2016 0420   LABBENZ NONE DETECTED 04/17/2016 0420   AMPHETMU NONE DETECTED 04/17/2016 0420   THCU NONE DETECTED 04/17/2016 0420   LABBARB NONE DETECTED 04/17/2016 0420     IMAGING  Ct Angio Head and Neck W Or Wo Contrast 04/17/2016 Significant motion degradation. Intracranial component of the examination is of borderline diagnostic utility.  Stable LEFT thalamic hemorrhage, without progression.  No intracranial vascular malformation or deep venous thrombosis.  RIGHT thalamic abnormality less impressive on CTA.  Query developmental venous anomaly accounting for linear hyperdensity, versus calcifications. If MRI brain is performed, consider formal MR venogram postcontrast to further assess. No extracranial or intracranial stenosis or dissection. Parenchymal interstitial prominence, in conjunction with bulky adenopathy, suggesting sarcoidosis.  Consider chest CT with contrast for further evaluation.   Ct Head Wo Contrast 04/17/2016 1. Unchanged appearance of the left posterior thalamic focus of acute intraparenchymal hemorrhage, most likely of a hypertensive etiology. No new midline shift or mass effect. No intraventricular extension of blood or hydrocephalus.  2. Unchanged linear hyperdensity within the posterior right thalamus, possibly an area of mineralization or a prominent vein, such as a developmental venous anomaly.  3. No new areas of hemorrhage.   Ct Head Code Stroke Wo Contrast` 04/17/2016 1. 21  x 16 x 19 mm left thalamic hemorrhage (estimated volume 3.2 cc). Mild localized edema without significant mass effect. No intraventricular extension.  2. Smaller 16 x 3 x 5 mm acute right thalamic hemorrhage without significant mass effect or intraventricular extension. Hypertensive etiology is suspected.  3. Mild atrophy with chronic microvascular ischemic disease.    PHYSICAL  EXAM General: Vital signs reviewed.  Patient is well-developed and well-nourished, in no acute distress and cooperative with exam.  Head: Normocephalic and atraumatic. Eyes: EOMI, conjunctivae normal, PERRLA.  Neck: Supple, trachea midline Cardiovascular: RRR Pulmonary/Chest: Clear to anterior auscultation bilaterally Abdominal: Soft, non-tender, non-distended, BS + Extremities: No lower extremity edema bilaterally Neurological: A&O x3, Naming intact, repetition intact. Follows simple and 2-step commands. Finger to nose intact. Strength is normal and symmetric bilaterally, face symmetric, normal tongue extension, PEERLA, EOMI, visual fields intact, no focal motor deficit, sensory intact to light touch bilaterally.    ASSESSMENT/PLAN [name] is a 45 y.o. male with history of HTN and T2DM presenting with confusion and found to have bilateral right and left sided thalamic hemorrhages. He did not receive IV t-PA due to hemorrhage.    45 year old with bilateral thalamic hemorrhages due to non-compliance with HTN medications. Still on Cleviprex, will adjust oral PO meds, will change goal to < 180 SBP, CT of the chest for concerns of sarcoidosis.  Personally examined patient and images, and have participated in and made any corrections needed to history, physical, neuro exam,assessment and plan as stated above.  I have personally obtained the history, evaluated lab date, reviewed imaging studies and agree with radiology interpretations. Hypertensive bleeds due to non-compliance. His exam is non-focal.   Bilateral Acute Thalamic Hemorrhagic Stroke: Likely secondary to uncontrolled hypertension due to noncompliance  Resultant no deficits  CT head: 21 x 16 x 19 mm left thalamic hemorrhage (estimated volume 3.2 cc). Mild localized edema without significant mass effect. No intraventricular extension. Smaller 16 x 3 x 5 mm acute right thalamic hemorrhage without significant mass effect or  intraventricular extension. Hypertensive etiology is suspected. Mild atrophy with chronic microvascular ischemic disease.  CTA head and neck:  Stable LEFT thalamic hemorrhage, without progression. No intracranial vascular malformation or deep venous thrombosis. RIGHT thalamic abnormality less impressive on CTA. Query developmental venous anomaly accounting for linear hyperdensity, versus calcifications. If MRI brain is performed, consider formal MR venogram postcontrast to further assess. No extracranial or intracranial stenosis or dissection.   MRI brain with and without contrast pending   MRV pending   2D Echo  ordered  LDL 120  HgbA1c - 8.7  SCDs for VTE prophylaxis- can start Lovenox SQ QD when appropriate Diet heart healthy/carb modified Room service appropriate? Yes; Fluid consistency: Thin  No antithrombotic prior to admission, now on No antithrombotic due to hemorrhage  Ongoing aggressive stroke risk factor management  Therapy recommendations: Outpatient PT recommended. OT evaluation pending.  Disposition:  pending   Hypertensive Urgency  Treated in the ICU with IV antihypertensives - Patient was maximized on Cardene, not switched to Cleviprex  Added labetalol 200 mg TID and amlodipine 10 mg daily  Lisinopril 10 mg daily added 04/19/2016 (titrate up as needed - watch renal function)  BP improving today  Long-term BP goal normotensive   SBP goal now > 180 mmHg  Hyperlipidemia  Home meds:  None  LDL 120, goal < 70  Add atorvastatin 40 mg QD at time of discharge  Diabetes  HgbA1c 8.7, goal < 7.0  On  SSI-M ACHS  Poorly controlled prior to admission  Other Stroke Risk Factors  Obesity, Body mass index is 31.48 kg/m., recommend weight loss, diet and exercise as appropriate   Other Active Problems  Parenchymal interstitial prominence, in conjunction with bulky adenopathy, suggesting sarcoidosis.     Chest CT  with contrast ordered 04/19/2016 R/O  sarcoidosis.  Mild Anemia - 12.6 / 37.6  Creatinine - 1.38 -> 1.30 -> 1.16  Hypokalemia - 3.1 -> supplement -> recheck -> 3.4  Transfer to floor when off drips.  Hospital day # 3  [facility] See Amion for Pager information 04/20/2016 11:28 AM  I have personally examined this patient, reviewed notes, independently viewed imaging studies, participated in medical decision making and plan of care.ROS completed by me personally and pertinent positives fully documented  I have made any additions or clarifications directly to the above note. Agree with note above. Check CT scan of the chest today for sarcoidosis an MRI scan on the brain with and without contrast and MRV for deep cerebral vein thrombosis versus thalamic venous angioma. Likely discharge home tomorrow morning if no therapy needs. Patient was counseled to be compliant with his blood pressure medications. Greater than 50% time during this 25 minute visit was spent on counseling and coordination of care about stroke and hemorrhage risk, prevention and treatment  Delia Heady, MD Medical Director [facility] Pager: [phone] 04/20/2016 1:12 PM  To contact Stroke Continuity provider, please refer to [email or web address]. After hours, contact General Neurology

## 2016-04-21 ENCOUNTER — Inpatient Hospital Stay (HOSPITAL_COMMUNITY): Payer: 59

## 2016-04-21 DIAGNOSIS — I10 Essential (primary) hypertension: Secondary | ICD-10-CM | POA: Diagnosis present

## 2016-04-21 DIAGNOSIS — D869 Sarcoidosis, unspecified: Secondary | ICD-10-CM | POA: Diagnosis present

## 2016-04-21 DIAGNOSIS — I6789 Other cerebrovascular disease: Secondary | ICD-10-CM

## 2016-04-21 DIAGNOSIS — E876 Hypokalemia: Secondary | ICD-10-CM | POA: Diagnosis not present

## 2016-04-21 DIAGNOSIS — D649 Anemia, unspecified: Secondary | ICD-10-CM | POA: Diagnosis present

## 2016-04-21 DIAGNOSIS — I160 Hypertensive urgency: Secondary | ICD-10-CM | POA: Diagnosis present

## 2016-04-21 DIAGNOSIS — E785 Hyperlipidemia, unspecified: Secondary | ICD-10-CM | POA: Diagnosis present

## 2016-04-21 DIAGNOSIS — E119 Type 2 diabetes mellitus without complications: Secondary | ICD-10-CM

## 2016-04-21 LAB — ECHOCARDIOGRAM COMPLETE
E decel time: 148 msec
EERAT: 16
FS: 43 % (ref 28–44)
HEIGHTINCHES: 72 in
IVS/LV PW RATIO, ED: 1.18
LA diam end sys: 42 mm
LA vol: 62.7 mL
LADIAMINDEX: 1.79 cm/m2
LASIZE: 42 mm
LAVOLA4C: 54.1 mL
LAVOLIN: 26.8 mL/m2
LDCA: 3.46 cm2
LV TDI E'MEDIAL: 6.2
LVEEAVG: 16
LVEEMED: 16
LVELAT: 5 cm/s
LVOT SV: 73 mL
LVOT VTI: 21.1 cm
LVOT diameter: 21 mm
LVOT peak vel: 99.8 cm/s
MV Dec: 148
MVPG: 3 mmHg
MVPKAVEL: 82 m/s
MVPKEVEL: 80 m/s
PW: 13.1 mm — AB (ref 0.6–1.1)
TAPSE: 21.9 mm
TDI e' lateral: 5
WEIGHTICAEL: 3714.31 [oz_av]

## 2016-04-21 LAB — BASIC METABOLIC PANEL
ANION GAP: 10 (ref 5–15)
BUN: 19 mg/dL (ref 6–20)
CHLORIDE: 101 mmol/L (ref 101–111)
CO2: 29 mmol/L (ref 22–32)
CREATININE: 1.4 mg/dL — AB (ref 0.61–1.24)
Calcium: 9.5 mg/dL (ref 8.9–10.3)
GFR calc non Af Amer: 59 mL/min — ABNORMAL LOW (ref >60)
GLUCOSE: 142 mg/dL — AB (ref 65–99)
Potassium: 3.7 mmol/L (ref 3.5–5.1)
Sodium: 140 mmol/L (ref 135–145)

## 2016-04-21 LAB — GLUCOSE, CAPILLARY
GLUCOSE-CAPILLARY: 129 mg/dL — AB (ref 65–99)
Glucose-Capillary: 135 mg/dL — ABNORMAL HIGH (ref 65–99)

## 2016-04-21 LAB — CBC
HCT: 41 % (ref 39.0–52.0)
HEMOGLOBIN: 13.3 g/dL (ref 13.0–17.0)
MCH: 28.2 pg (ref 26.0–34.0)
MCHC: 32.4 g/dL (ref 30.0–36.0)
MCV: 86.9 fL (ref 78.0–100.0)
Platelets: 230 10*3/uL (ref 150–400)
RBC: 4.72 MIL/uL (ref 4.22–5.81)
RDW: 13.7 % (ref 11.5–15.5)
WBC: 5.7 10*3/uL (ref 4.0–10.5)

## 2016-04-21 LAB — ANGIOTENSIN CONVERTING ENZYME: Angiotensin-Converting Enzyme: 73 U/L (ref 14–82)

## 2016-04-21 MED ORDER — GADOBENATE DIMEGLUMINE 529 MG/ML IV SOLN
20.0000 mL | Freq: Once | INTRAVENOUS | Status: AC
  Administered 2016-04-21: 20 mL via INTRAVENOUS

## 2016-04-21 MED ORDER — AMLODIPINE BESYLATE 10 MG PO TABS: 10.0000 mg | ORAL_TABLET | Freq: Every day | ORAL | 2 refills | Status: DC

## 2016-04-21 MED ORDER — METFORMIN HCL 500 MG PO TABS: 500.0000 mg | ORAL_TABLET | Freq: Two times a day (BID) | ORAL | Status: DC

## 2016-04-21 MED ORDER — LISINOPRIL 20 MG PO TABS: 20.0000 mg | ORAL_TABLET | Freq: Every day | ORAL | 2 refills | Status: DC

## 2016-04-21 MED ORDER — ATORVASTATIN CALCIUM 40 MG PO TABS: 40.0000 mg | ORAL_TABLET | Freq: Every day | ORAL | Status: DC

## 2016-04-21 MED ORDER — LABETALOL HCL 200 MG PO TABS: 400.0000 mg | ORAL_TABLET | Freq: Two times a day (BID) | ORAL | 2 refills | Status: DC

## 2016-04-21 MED ORDER — METFORMIN HCL 500 MG PO TABS: 500.0000 mg | ORAL_TABLET | Freq: Two times a day (BID) | ORAL | 2 refills | Status: AC

## 2016-04-21 MED ORDER — LISINOPRIL 20 MG PO TABS
20.0000 mg | ORAL_TABLET | Freq: Every day | ORAL | Status: DC
  Administered 2016-04-21: 20 mg via ORAL
  Filled 2016-04-21: qty 1

## 2016-04-21 MED ORDER — ATORVASTATIN CALCIUM 40 MG PO TABS: 40.0000 mg | ORAL_TABLET | Freq: Every day | ORAL | 2 refills | Status: AC

## 2016-04-21 NOTE — Progress Notes (Signed)
Patient given discharge instructions with family at chairside.  All questions and concerns addressed.  Catheter removed without difficulty.

## 2016-04-21 NOTE — Progress Notes (Signed)
STROKE TEAM PROGRESS NOTE   HISTORY OF PRESENT ILLNESS (per record) Danny ClicheBobby Tottenis an 45 y.o.malewith a history of diabetes mellitus and hypertension and noncompliance with treatment who [facility] acute onset of confusion, speech change and right-sided weakness. Code stroke was activated. Blood pressure was markedly elevated. CT scan of his head showed an acute left thalamic hemorrhage, as well as a smaller acute right thalamic hemorrhage. There was no ventricular extension of hemorrhage. Minimal localized edema was noted on the left. Blood pressure was 233/139 on admission to the ED. He was given labetalol and subsequently started on Cardene drip. [facility] was [facility]. NIH stroke score was 2 time of this evaluation.  Patient was not administered IV t-PA secondary to ICH. He was admitted to the neuro ICU for further evaluation and treatment.   SUBJECTIVE (INTERVAL HISTORY) Patient without complaints. CT scan of the chest does show pulmonary nodular densities raising concern for sarcoidosis. Patient just returned from MRI scan and official report is pending   OBJECTIVE Temp:  [97.9 F (36.6 C)-98.7 F (37.1 C)] 98.3 F (36.8 C) (12/26 1300) Pulse Rate:  [77-89] 78 (12/26 1300) Cardiac Rhythm: Normal sinus rhythm (12/26 0700) Resp:  [18-20] 18 (12/26 1300) BP: (160-187)/(86-105) 164/92 (12/26 1300) SpO2:  [95 %-100 %] 96 % (12/26 1300)  CBC:   Recent Labs Lab 04/17/16 0345  04/18/16 0328 04/21/16 0527  WBC 6.1  --  5.9 5.7  NEUTROABS 3.9  --   --   --   HGB 14.1  < > 12.6* 13.3  HCT 42.7  < > 37.6* 41.0  MCV 88.0  --  85.5 86.9  PLT 234  --  219 230  < > = values in this interval not displayed.  Basic Metabolic Panel:   Recent Labs Lab 04/19/16 0326 04/21/16 0527  NA 137 140  K 3.4* 3.7  CL 103 101  CO2 25 29  GLUCOSE 206* 142*  BUN 14 19  CREATININE 1.16 1.40*  CALCIUM 8.8* 9.5    Lipid Panel:      Component Value Date/Time   CHOL 198 04/17/2016 1012   TRIG 91 04/17/2016 1012   HDL 60 04/17/2016 1012   CHOLHDL 3.3 04/17/2016 1012   VLDL 18 04/17/2016 1012   LDLCALC 120 (H) 04/17/2016 1012    Urine Drug Screen:     Component Value Date/Time   LABOPIA NONE DETECTED 04/17/2016 0420   COCAINSCRNUR NONE DETECTED 04/17/2016 0420   LABBENZ NONE DETECTED 04/17/2016 0420   AMPHETMU NONE DETECTED 04/17/2016 0420   THCU NONE DETECTED 04/17/2016 0420   LABBARB NONE DETECTED 04/17/2016 0420     IMAGING  Ct Angio Head and Neck W Or Wo Contrast 04/17/2016 Significant motion degradation. Intracranial component of the examination is of borderline diagnostic utility.  Stable LEFT thalamic hemorrhage, without progression.  No intracranial vascular malformation or deep venous thrombosis.  RIGHT thalamic abnormality less impressive on CTA.  Query developmental venous anomaly accounting for linear hyperdensity, versus calcifications. If MRI brain is performed, consider formal MR venogram postcontrast to further assess. No extracranial or intracranial stenosis or dissection. Parenchymal interstitial prominence, in conjunction with bulky adenopathy, suggesting sarcoidosis.  Consider chest CT with contrast for further evaluation.   Ct Head Wo Contrast 04/17/2016 1. Unchanged appearance of the left posterior thalamic focus of acute intraparenchymal hemorrhage, most likely of a hypertensive etiology. No new midline shift or mass effect. No intraventricular extension of blood  or hydrocephalus.  2. Unchanged linear hyperdensity within the posterior right thalamus, possibly an area of mineralization or a prominent vein, such as a developmental venous anomaly.  3. No new areas of hemorrhage.   Ct Head Code Stroke Wo Contrast` 04/17/2016 1. 21 x 16 x 19 mm left thalamic hemorrhage (estimated volume 3.2 cc). Mild localized edema without significant mass effect. No intraventricular extension.  2.  Smaller 16 x 3 x 5 mm acute right thalamic hemorrhage without significant mass effect or intraventricular extension. Hypertensive etiology is suspected.  3. Mild atrophy with chronic microvascular ischemic disease.    PHYSICAL EXAM General: Vital signs reviewed.  Patient is well-developed and well-nourished, in no acute distress and cooperative with exam.  Head: Normocephalic and atraumatic. Eyes: EOMI, conjunctivae normal, PERRLA.  Neck: Supple, trachea midline Cardiovascular: RRR Pulmonary/Chest: Clear to anterior auscultation bilaterally Abdominal: Soft, non-tender, non-distended, BS + Extremities: No lower extremity edema bilaterally Neurological: A&O x3, Naming intact, repetition intact. Follows simple and 2-step commands. Finger to nose intact. Strength is normal and symmetric bilaterally, face symmetric, normal tongue extension, PEERLA, EOMI, visual fields intact, no focal motor deficit, sensory intact to light touch bilaterally.    ASSESSMENT/PLAN [name] is a 45 y.o. male with history of HTN and T2DM presenting with confusion and found to have bilateral right and left sided thalamic hemorrhages. He did not receive IV t-PA due to hemorrhage.    45 year old with bilateral thalamic hemorrhages due to non-compliance with HTN medications. Still on Cleviprex, will adjust oral PO meds, will change goal to < 180 SBP, CT of the chest for concerns of sarcoidosis.  Personally examined patient and images, and have participated in and made any corrections needed to history, physical, neuro exam,assessment and plan as stated above.  I have personally obtained the history, evaluated lab date, reviewed imaging studies and agree with radiology interpretations. Hypertensive bleeds due to non-compliance. His exam is non-focal.   Bilateral Acute Thalamic Hemorrhagic Stroke: Likely secondary to uncontrolled hypertension due to noncompliance  Resultant no deficits  CT head: 21 x 16 x 19 mm  left thalamic hemorrhage (estimated volume 3.2 cc). Mild localized edema without significant mass effect. No intraventricular extension. Smaller 16 x 3 x 5 mm acute right thalamic hemorrhage without significant mass effect or intraventricular extension. Hypertensive etiology is suspected. Mild atrophy with chronic microvascular ischemic disease.  CTA head and neck:  Stable LEFT thalamic hemorrhage, without progression. No intracranial vascular malformation or deep venous thrombosis. RIGHT thalamic abnormality less impressive on CTA. Query developmental venous anomaly accounting for linear hyperdensity, versus calcifications. If MRI brain is performed, consider formal MR venogram postcontrast to further assess. No extracranial or intracranial stenosis or dissection.   MRI brain with and without contrast pending   MRV pending   2D Echo  ordered  LDL 120  HgbA1c - 8.7  SCDs for VTE prophylaxis- can start Lovenox SQ QD when appropriate Diet heart healthy/carb modified Room service appropriate? Yes; Fluid consistency: Thin  No antithrombotic prior to admission, now on No antithrombotic due to hemorrhage  Ongoing aggressive stroke risk factor management  Therapy recommendations: Outpatient PT recommended. OT evaluation pending.  Disposition:  pending   Hypertensive Urgency  Treated in the ICU with IV antihypertensives - Patient was maximized on Cardene, not switched to Cleviprex  Added labetalol 200 mg TID and amlodipine 10 mg daily  Lisinopril 10 mg daily added 04/19/2016 (titrate up as needed - watch renal function)  BP improving today  Long-term BP goal normotensive   SBP goal now > 180 mmHg  Hyperlipidemia  Home meds:  None  LDL 120, goal < 70  Add atorvastatin 40 mg QD at time of discharge  Diabetes  HgbA1c 8.7, goal < 7.0  On SSI-M ACHS  Poorly controlled prior to admission  Other Stroke Risk Factors  Obesity, Body mass index is 31.48 kg/m., recommend weight  loss, diet and exercise as appropriate   Other Active Problems  Parenchymal interstitial prominence, in conjunction with bulky adenopathy, suggesting sarcoidosis.     Chest CT  with contrast ordered 04/19/2016 R/O sarcoidosis.  Mild Anemia - 12.6 / 37.6  Creatinine - 1.38 -> 1.30 -> 1.16  Hypokalemia - 3.1 -> supplement -> recheck -> 3.4  Transfer to floor when off drips.  Stevens day # 4  Deandrea Vanpelt  [facility] See Amion for Pager information 04/21/2016 11:17 AM  I have personally examined this patient, reviewed notes, independently viewed imaging studies, participated in medical decision making and plan of care.ROS completed by me personally and pertinent positives fully documented  I have made any additions or clarifications directly to the above note. Agree with note above. Check  Results of  MRI scan on the brain with and without contrast and MRV for deep cerebral vein thrombosis versus thalamic venous angioma. Likely discharge home later today. Patient was counseled to be compliant with his blood pressure medications.  Follow-up as an outpatient in stroke clinic in 6 weeks.  Delia HeadyPramod Lavaun Greenfield, MD Medical Director [facility] Pager: [phone] 04/21/2016 11:17 AM  To contact Stroke Continuity provider, please refer to [email or web address]. After hours, contact General Neurology

## 2016-04-21 NOTE — Progress Notes (Signed)
Physical Therapy Treatment Patient Details Name: Danny [facility] MRN: [id number] DOB: Mar 26, 1971 Today's Date: 04/21/2016    History of Present Illness Patient is a 45 y/o male with hx of DM, HTN, noncompliance with treatment who presents to the ED at [facility] with acute onset of confusion, speech change and right-sided weakness. NIH 2. CT- acute left thalamic infarction, as well as a smaller acute right thalamic infarction    PT Comments    Excellent improvements in functional mobility and R side attention since last session; Overall independent with mobility; Will defer to MD for guidance re: return to driving; Acute PT goals met, recommendations updated; no further acute PT needs noted; will sign off.   Follow Up Recommendations  No PT follow up     Equipment Recommendations  None recommended by PT    Recommendations for Other Services       Precautions / Restrictions Precautions Precautions: None Restrictions Weight Bearing Restrictions: No    Mobility  Bed Mobility Overal bed mobility: Independent             General bed mobility comments: Reported no difficulty getting OOB  Transfers Overall transfer level: Independent Equipment used: None Transfers: Sit to/from Stand Sit to Stand: Independent            Ambulation/Gait Ambulation/Gait assistance: Independent Ambulation Distance (Feet): 500 Feet (greater than) Assistive device: None Gait Pattern/deviations: WFL(Within Functional Limits)   Gait velocity interpretation: at or above normal speed for age/gender General Gait Details: No collisions with obstacle on right this session; Independent and WNL   Stairs Stairs: Yes   Stair Management: One rail Right;Alternating pattern;Forwards Number of Stairs: 12 General stair comments: no difficulty  Wheelchair Mobility    Modified Rankin (Stroke Patients Only) Modified Rankin (Stroke Patients Only) Pre-Morbid Rankin Score: No  symptoms Modified Rankin: No symptoms     Balance     Sitting balance-Leahy Scale: Normal       Standing balance-Leahy Scale: Normal                      Cognition Arousal/Alertness: Awake/alert Behavior During Therapy: WFL for tasks assessed/performed Overall Cognitive Status: Within Functional Limits for tasks assessed                      Exercises      General Comments General comments (skin integrity, edema, etc.): We discussed signs and symptoms of a stroke and risk factors, stressing the need for regular follow-up with PCP       Pertinent Vitals/Pain Pain Assessment: No/denies pain    Home Living Family/patient expects to be discharged to:: Private residence Living Arrangements: Spouse/significant other;Children Available Help at Discharge: Family;Available PRN/intermittently Type of Home: House Home Access: Level entry   Home Layout: Two level;Bed/bath upstairs Home Equipment: None      Prior Function Level of Independence: Independent      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   PT Goals (current goals can now be found in the care plan section) Acute Rehab PT Goals Patient Stated Goal: return home  Progress towards PT goals: Goals met/education completed, patient discharged from PT    Frequency    Min 4X/week      PT Plan Discharge plan needs to be updated    Co-evaluation             End of Session   Activity Tolerance: Patient tolerated treatment well Patient left: in  chair;with call bell/phone within reach;with chair alarm set     Time: [phone] PT Time Calculation (min) (ACUTE ONLY): 13 min  Charges:  $Gait Training: 8-22 mins                    G Codes:      Colletta [place] 2016-05-06, 12:57 PM  Roney [place], [place] Pager [phone] Office [phone]

## 2016-04-21 NOTE — Progress Notes (Signed)
  Echocardiogram 2D Echocardiogram has been performed.  Tye SavoyCasey N Danny Stevens 04/21/2016, 3:16 PM

## 2016-04-21 NOTE — Discharge Summary (Signed)
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  Past Medical History:  Diagnosis Date  . Diabetes mellitus without complication (HCC)   . Hypertension    Past Surgical History:  Procedure Laterality Date  . VASECTOMY  2013    Allergies as of 04/21/2016   No Known Allergies     Medication List    TAKE these medications   amLODipine 10 MG tablet Commonly known as:  NORVASC Take 1 tablet (10 mg total) by mouth daily. Start taking on:  04/22/2016   labetalol 200 MG tablet Commonly known as:  NORMODYNE Take 2 tablets (400 mg total) by mouth 2 (two) times daily.   lisinopril 20 MG tablet Commonly known as:  PRINIVIL,ZESTRIL Take 1 tablet (20 mg total) by mouth daily. Start taking on:  04/22/2016   metFORMIN 500 MG tablet Commonly known as:  GLUCOPHAGE Take 1 tablet (500 mg total) by mouth 2 (two) times daily with a meal.   multivitamin with minerals Tabs tablet Take 1 tablet by mouth daily.       LABORATORY STUDIES CBC    Component Value Date/Time   WBC 5.7 04/21/2016 0527   RBC 4.72 04/21/2016 0527   HGB 13.3 04/21/2016 0527   HCT 41.0 04/21/2016 0527   PLT 230 04/21/2016 0527   MCV 86.9 04/21/2016 0527   MCH 28.2 04/21/2016 0527   MCHC 32.4 04/21/2016 0527   RDW 13.7 04/21/2016 0527   LYMPHSABS 1.4 04/17/2016 0345   MONOABS 0.6 04/17/2016 0345   EOSABS 0.2 04/17/2016 0345   BASOSABS 0.0 04/17/2016 0345   CMP    Component Value Date/Time   NA 140  04/21/2016 0527   K 3.7 04/21/2016 0527   CL 101 04/21/2016 0527   CO2 29 04/21/2016 0527   GLUCOSE 142 (H) 04/21/2016 0527   BUN 19 04/21/2016 0527   CREATININE 1.40 (H) 04/21/2016 0527   CREATININE 1.19 09/07/2012 1236   CALCIUM 9.5 04/21/2016 0527   PROT 7.5 04/17/2016 0345   ALBUMIN 3.4 (L) 04/17/2016 0345   AST 20 04/17/2016 0345   ALT 19 04/17/2016 0345   ALKPHOS 106 04/17/2016 0345   BILITOT 0.5 04/17/2016 0345   GFRNONAA 59 (L) 04/21/2016 0527   GFRAA >60 04/21/2016 0527   COAGS Lab Results  Component Value Date   INR 0.93 04/17/2016   Lipid Panel    Component Value Date/Time   CHOL 198 04/17/2016 1012   TRIG 91 04/17/2016 1012   HDL 60 04/17/2016 1012   CHOLHDL 3.3 04/17/2016 1012   VLDL 18 04/17/2016 1012   LDLCALC 120 (H) 04/17/2016 1012   HgbA1C  Lab Results  Component Value Date   HGBA1C 8.7 (H) 04/17/2016   Cardiac Panel (last 3 results) No results for input(s): CKTOTAL, CKMB, TROPONINI, RELINDX in the last 72 hours. Urinalysis    Component Value Date/Time   COLORURINE YELLOW 04/17/2016 0340   APPEARANCEUR CLEAR 04/17/2016 0340   LABSPEC 1.010 04/17/2016 0340  PHURINE 6.0 04/17/2016 0340   GLUCOSEU 250 (A) 04/17/2016 0340   HGBUR TRACE (A) 04/17/2016 0340   BILIRUBINUR NEGATIVE 04/17/2016 0340   BILIRUBINUR neg 09/07/2012 1209   KETONESUR NEGATIVE 04/17/2016 0340   PROTEINUR 30 (A) 04/17/2016 0340   UROBILINOGEN negative 09/07/2012 1209   NITRITE NEGATIVE 04/17/2016 0340   LEUKOCYTESUR NEGATIVE 04/17/2016 0340   Urine Drug Screen     Component Value Date/Time   LABOPIA NONE DETECTED 04/17/2016 0420   COCAINSCRNUR NONE DETECTED 04/17/2016 0420   LABBENZ NONE DETECTED 04/17/2016 0420   AMPHETMU NONE DETECTED 04/17/2016 0420   THCU NONE DETECTED 04/17/2016 0420   LABBARB NONE DETECTED 04/17/2016 0420    Alcohol Level    Component Value Date/Time   [facility] 04/17/2016 0345     SIGNIFICANT DIAGNOSTIC STUDIES  Mr Laqueta JeanBrain W Wo  Contrast 04/21/2016 No acute thrombotic infarct. 1.8 cm left thalamic hematoma. Blood breakdown products within the right thalamus may indicate result of prior hemorrhage with blood-stained cleft. Tiny blood breakdown products within the cerebellum and pontomedullary junction suggestive of prior episodes hemorrhagic ischemia. Remote tiny bilateral corona radiata infarcts and remote tiny right periatrial infarct. Moderate patchy and punctate white matter changes most likely related to result of chronic microvascular changes in the present clinical setting. Atrophy without hydrocephalus. Sulcal prominence greatest posterior left frontal-parietal region felt most likely to be related to atrophy rather than arachnoid cyst. Mucosal thickening maxillary sinuses greater on left measuring up to 8 mm. Minimal mucosal thickening ethmoid sinus air cells bilaterally. Heterogeneous appearance of the parotid gland bilaterally may indicate changes of result of underlying inflammatory process such as Sjogren's disease.   Mr [name] 04/21/2016 Major dural sinuses are patent with dominant drainage to the right. Of note is that the internal cerebral veins are patent.   Ct Chest W Contrast 1. Thoracic findings which are most consistent with sarcoidosis. Adenopathy with perilymphatic distribution upper lobe nodularity. 2. Mild upper abdominal adenopathy which may also represent sarcoidosis. Abdominopelvic imaging may be informative for further evaluation. 3. Right adrenal nodule is strongly favored (but not diagnostic of) an adenoma. If the patient undergoes abdominal imaging, consider precontrast evaluation of the abdomen. If not, follow-up with adrenal protocol CT at 1 year should be considered. 4. Cardiomegaly with left ventricular hypertrophy. 5. Aberrant right subclavian artery.   Ct Angio Head and Neck W Or Wo Contrast 04/17/2016 Significant motion degradation. Intracranial component of the examination is of  borderline diagnostic utility.  Stable LEFT thalamic hemorrhage, without progression.  No intracranial vascular malformation or deep venous thrombosis.  RIGHT thalamic abnormality less impressive on CTA.  Query developmental venous anomaly accounting for linear hyperdensity, versus calcifications. If MRI brain is performed, consider formal MR venogram postcontrast to further assess. No extracranial or intracranial stenosis or dissection. Parenchymal interstitial prominence, in conjunction with bulky adenopathy, suggesting sarcoidosis.  Consider chest CT with contrast for further evaluation.   Ct Head Wo Contrast 04/17/2016 1. Unchanged appearance of the left posterior thalamic focus of acute intraparenchymal hemorrhage, most likely of a hypertensive etiology. No new midline shift or mass effect. No intraventricular extension of blood or hydrocephalus.  2. Unchanged linear hyperdensity within the posterior right thalamus, possibly an area of mineralization or a prominent vein, such as a developmental venous anomaly.  3. No new areas of hemorrhage.   Ct Head Code Stroke Wo Contrast` 04/17/2016 1. 21 x 16 x 19 mm left thalamic hemorrhage (estimated volume 3.2 cc). Mild localized edema without  significant mass effect. No intraventricular extension.  2. Smaller 16 x 3 x 5 mm acute right thalamic hemorrhage without significant mass effect or intraventricular extension. Hypertensive etiology is suspected.  3. Mild atrophy with chronic microvascular ischemic disease.   2D Echocardiogram  pending      HISTORY OF PRESENT ILLNESS Vivek Tottenis an 45 y.o.malewith a history of diabetes mellitus and hypertension and noncompliancewith treatment who [facility] speech change and right-sidedweakness.Code stroke was activated. Blood pressure was markedly elevated. CT scan of his head showed an acute left thalamic hemorrhage, as well as a  smaller acute right thalamic hemorrhage. There was no ventricular extension of hemorrhage. Minimal localized edema was noted on the left. Blood pressure was 233/139on admission to the ED. He was given labetalol and subsequently started on Cardene drip. [facility]. NIH stroke score was 2 time of this evaluation. Patient was not administered IV t-PA secondary to ICH. Hewas admitted to the neuro ICU for further evaluation and treatment. [facility]    HOSPITAL COURSE Mr. Grayland OrmondBobby Mikhail is a 45 y.o. male with history of HTN and T2DM presenting with confusion and found to have bilateral right and left sided thalamic hemorrhages. He did not receive IV t-PA due to hemorrhage.   Bilateral Acute Thalamic Hemorrhagic Stroke: Likely secondary to uncontrolled hypertension due to noncompliance  Resultant no deficits  CT head: 21 x 16 x 19 mm left thalamic hemorrhage (estimated volume 3.2 cc). Mild localized edema without significant mass effect. No intraventricular extension. Smaller 16 x 3 x 5 mm acute right thalamic hemorrhage without significant mass effect or intraventricular extension. Hypertensive etiology is suspected. Mild atrophy with chronic microvascular ischemic disease.  CTA head and neck:  Stable LEFT thalamic hemorrhage, without progression. No intracranial vascular malformation or deep venous thrombosis. RIGHT thalamic abnormality less impressive on CTA. Query developmental venous anomaly accounting for linear hyperdensity, versus calcifications. If MRI brain is performed, consider formal MR venogram postcontrast to further assess. No extracranial or intracranial stenosis or dissection.   MRI brain with and without contrast pending   MRV pending   2D Echo  pending   LDL 120  HgbA1c - 8.7  No antithrombotic prior to admission, now on No antithrombotic due to hemorrhage  Ongoing aggressive stroke risk factor management  Therapy recommendations:  no therapy needs   Disposition:  discharge home  Hypertensive Urgency  Treated in the ICU with IV antihypertensives - Patient was maximized on Cardene, not switched to Cleviprex  Added labetalol 200 mg TID and amlodipine 10 mg daily  Lisinopril 10 mg daily added 04/19/2016   BP improving by discharge  Long-term BP goal normotensive   Hyperlipidemia  Home meds:  None  LDL 120, goal < 70  Add atorvastatin 40 mg QD at time of discharge  Diabetes  Not on meds PTA  HgbA1c 8.7, goal < 7.0  Metformin 500 bid added at discharge. GFR 59  Other Stroke Risk Factors  Obesity, Body mass index is 31.48 kg/m., recommend weight loss, diet and exercise as appropriate   Other Active Problems  Parenchymal interstitial prominence, in conjunction with bulky adenopathy, suggesting sarcoidosis. Chest CT confirmed likely sarcoid. Also, possible adrenal adenoma that will need followup. Referred to [facility] for follow up 05/04/16 at 945a  Mild Anemia, resolved - 12.6 / 37.6 -> 13.0  Elevated Creatinine - 1.38 -> 1.30 -> 1.16 -> 1.4  Hypokalemia, resolved -  3.1 -> supplement -> recheck -> 3.7   DISCHARGE EXAM Blood pressure (!) 164/92, pulse 78, temperature 98.3 F (36.8 C), temperature source Oral, resp. rate 18, height 6' (1.829 m), weight 105.3 kg (232 lb 2.3 oz), SpO2 96 %. General: Vital signs reviewed.  Patient is well-developed and well-nourished, in no acute distress and cooperative with exam.  Head: Normocephalic and atraumatic. Eyes: EOMI, conjunctivae normal, PERRLA.  Neck: Supple, trachea midline Cardiovascular: RRR Pulmonary/Chest: Clear to anterior auscultation bilaterally Abdominal: Soft, non-tender, non-distended, BS + Extremities: No lower extremity edema bilaterally Neurological: A&O x3, Naming intact, repetition intact. Follows simple and 2-step commands. Finger to nose intact. Strength is normal and symmetric bilaterally, face symmetric, normal tongue  extension, PEERLA, EOMI, visual fields intact, no focal motor deficit, sensory intact to light touch bilaterally.    Discharge Diet   Diet heart healthy/carb modified Room service appropriate? Yes; Fluid consistency: Thin liquids  DISCHARGE PLAN  Disposition:  Home  Ongoing risk factor control by Primary Care Physician at time of discharge  Follow-up HUSAIN,KARRAR, MD in 2 weeks.  Follow-up Dr. Isaiah SergeMannam 05/04/2016 at 945a, pulmonologist given sarcoidosis  Follow-up with Dr. Delia HeadyPramod Lucian Baswell, Stroke Clinic in 6 weeks, office to schedule an appointment.  45 minutes were spent preparing discharge.  Rhoderick MoodyBIBY,SHARON  [facility] See Amion for Pager information 04/21/2016 3:52 PM   I have personally examined this patient, reviewed notes, independently viewed imaging studies, participated in medical decision making and plan of care.ROS completed by me personally and pertinent positives fully documented  I have made any additions or clarifications directly to the above note. Agree with note above.   Delia HeadyPramod Thang Flett, MD Medical Director [facility] Pager: [phone] 04/21/2016 5:59 PM

## 2016-04-21 NOTE — Discharge Instructions (Signed)
Hypertension Hypertension is another name for high blood pressure. High blood pressure forces your heart to work harder to pump blood. A blood pressure reading has two numbers, which includes a higher number over a lower number (example: 110/72). Follow these instructions at home:  Have your blood pressure rechecked by your doctor.  Only take medicine as told by your doctor. Follow the directions carefully. The medicine does not work as well if you skip doses. Skipping doses also puts you at risk for problems.  Do not smoke.  Monitor your blood pressure at home as told by your doctor. Contact a doctor if:  You think you are having a reaction to the medicine you are taking.  You have repeat headaches or feel dizzy.  You have puffiness (swelling) in your ankles.  You have trouble with your vision. Get help right away if:  You get a very bad headache and are confused.  You feel weak, numb, or faint.  You get chest or belly (abdominal) pain.  You throw up (vomit).  You cannot breathe very well. This information is not intended to replace advice given to you by your health care provider. Make sure you discuss any questions you have with your health care provider. Document Released: 09/30/2007 Document Revised: 09/19/2015 Document Reviewed: 02/03/2013 Elsevier Interactive Patient Education  2017 [employer]   Hemorrhagic Stroke A hemorrhagic stroke is the sudden death of brain tissue that occurs when a blood vessel in the brain leaks or bursts (ruptures). When this happens, certain areas of the brain do not get enough oxygen, and blood builds up and presses on certain areas of the brain (hemorrhage). Lack of oxygen and pressure from hemorrhaging can lead to brain damage. There are two major types of hemorrhagic stroke, depending on where bleeding occurs. If bleeding occurs within the brain tissue, the condition is called an intracerebral hemorrhage. If bleeding occurs in the area  between the brain and the membrane that covers the brain (subarachnoid space), the condition is called a subarachnoid hemorrhage. Hemorrhagic stroke is a medical emergency. It can cause temporary or permanent brain damage and loss of brain function. What are the causes? This condition is caused by a blood vessel leaking or rupturing, which may be the result of:  Part of a weakened blood vessel wall bulging or ballooning out (cerebral aneurysm).  A hardened, thin blood vessel cracking open and allowing blood to leak out. Blood vessels may become hardened and thin due to plaque buildup.  Tangled blood vessels in the brain (brain arteriovenous malformation).  Protein buildup on artery walls in the brain (amyloid angiopathy).  Inflamed blood vessels (vasculitis).  A tumor in the brain.  High blood pressure (hypertension). What increases the risk? The following factors may make you more likely to develop this condition:  Hypertension.  Having abnormal blood vessels present since birth (congenital abnormality).  Bleeding disorders, such as hemophilia, sickle cell disease, or liver disease.  The blood becoming too thin while taking blood thinners (anticoagulants).  Aging.  Moderate or heavy alcohol use.  Using drugs, such as cocaine or methamphetamines. What are the signs or symptoms? Symptoms of this condition usually appear suddenly, and may include:  Weakness or numbness of the face, arm, or leg, especially on one side of the body.  Confusion.  Difficulty speaking (aphasia) or understanding speech.  Difficulty seeing out of one or both eyes.  Difficulty walking or moving the arms or legs.  Dizziness.  Loss of balance or coordination.  Seizures.  A severe headache with no known cause. This headache may feel like the worst headache ever experienced. How is this diagnosed? This condition may be diagnosed based on:  Your symptoms.  Your medical history.  A physical  exam.  Tests, including:  Blood tests.  CT scan.  MRI.  Angiogram. In this procedure, dye is injected through a long, thin tube (catheter) into one of your arteries. Then, X-rays are taken. The X-rays will show whether there is a blockage or a problem in a blood vessel. How is this treated? This condition is a medical emergency that must be treated in a hospital immediately. The goals of treatment are to stop bleeding, reduce pressure on the brain, and relieve symptoms. Treatment may include:  Medicines that:  Lower blood pressure (antihypertensives).  Relieve pain (analgesics).  Relieve nausea or vomiting.  Stop or prevent seizures (anticonvulsants).  Relieve fever.  Prevent blood vessels in the brain from spasming in response to bleeding.  Control bleeding in the brain.  Assisted breathing (ventilation). This involves using a machine to help you breathe (ventilator).  Receiving donated blood products through an IV tube (transfusion). You will receive cells that help your blood clot.  Placement of a tube (shunt) in the brain to relieve pressure.  Physical, speech, or occupational therapy.  Surgery to stop bleeding, remove a blood clot or tumor, or reduce pressure. Treatment depends on the cause, severity, and duration of symptoms. Medicines and changes to your diet may be used to help treat and manage risk factors for stroke, such as diabetes and high blood pressure. Follow these instructions at home: Activity  Return to your normal activities as told by your health care provider. Ask your health care provider what activities are safe for you.  Rest. Rest helps the brain to heal. Make sure you:  Get plenty of sleep. Avoid staying up late at night.  Keep a consistent sleep schedule. Try to go to sleep and wake up at about the same time every day.  Avoid activities that cause physical or mental stress. General instructions  Take over-the-counter and prescription  medicines only as told by your health care provider.  Do not drive or operate heavy machinery until your health care provider approves.  Limit alcohol intake to no more than 1 drink per day for nonpregnant women and 2 drinks per day for men. One drink equals 12 oz of beer, 5 oz of wine, or 1 oz of hard liquor.  Use a walker or a cane as told by your health care provider.  Keep all follow-up visits as told by your health care provider, including visits with therapists. This is important. How is this prevented? Your risk of stroke can be decreased by working with your health care provider to treat high blood pressure, high cholesterol, diabetes, heart disease, and obesity. Your risk of stroke can also be decreased by quitting smoking, limiting alcohol, and staying physically active. If you take the blood thinner warfarin, have your bloodwork monitored frequently by your health care provider. Contact a health care provider if: You develop any of the following symptoms:  Headaches that keep coming back (chronic headaches).  Nausea.  Vision problems.  Increased sensitivity to noise or light.  Depression or mood swings.  Anxiety or irritability.  Memory problems.  Difficulty concentrating or paying attention.  Sleep problems.  Feeling tired all of the time. Recovery from hemorrhagic stroke varies widely. Talk with your health care provider about what to expect  during your recovery. Get help right away if:  You develop symptoms of a hemorrhagic stroke.  You have a partial or total loss of consciousness.  You are taking blood thinners and you fall or you experience minor injury (trauma) to the head.  You have a bleeding disorder and you fall or you experience minor trauma to the head. These symptoms may represent a serious problem that is an emergency. Do not wait to see if the symptoms will go away. Get medical help right away. Call your local emergency services (911 in the  [place] Do not drive yourself to the hospital.  This information is not intended to replace advice given to you by your health care provider. Make sure you discuss any questions you have with your health care provider. Document Released: 10/01/2009 Document Revised: 09/19/2015 Document Reviewed: 04/07/2015 Elsevier Interactive Patient Education  2017 [employer]

## 2016-04-21 NOTE — Progress Notes (Signed)
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  Past Medical History:  Diagnosis Date  . Diabetes mellitus without complication (HCC)   . Hypertension    Past Surgical History:  Procedure Laterality Date  . VASECTOMY  2013    Allergies as of 04/21/2016   No Known Allergies     Medication List    TAKE these medications   amLODipine 10 MG tablet Commonly known as:  NORVASC Take 1 tablet (10 mg total) by mouth daily. Start taking on:  04/22/2016   labetalol 200 MG tablet Commonly known as:  NORMODYNE Take 2 tablets (400 mg total) by mouth 2 (two) times daily.   lisinopril 20 MG tablet Commonly known as:  PRINIVIL,ZESTRIL Take 1 tablet (20 mg total) by mouth daily. Start taking on:  04/22/2016   metFORMIN 500 MG tablet Commonly known as:  GLUCOPHAGE Take 1 tablet (500 mg total) by mouth 2 (two) times daily with a meal.   multivitamin with minerals Tabs tablet Take 1 tablet by mouth daily.       LABORATORY STUDIES CBC    Component Value Date/Time   WBC 5.7 04/21/2016 0527   RBC 4.72 04/21/2016 0527   HGB 13.3 04/21/2016 0527   HCT 41.0 04/21/2016 0527   PLT 230 04/21/2016 0527   MCV 86.9 04/21/2016 0527   MCH 28.2 04/21/2016 0527   MCHC 32.4 04/21/2016 0527   RDW 13.7 04/21/2016 0527   LYMPHSABS 1.4 04/17/2016 0345   MONOABS 0.6 04/17/2016 0345   EOSABS 0.2 04/17/2016 0345   BASOSABS 0.0 04/17/2016 0345   CMP    Component Value Date/Time   NA 140  04/21/2016 0527   K 3.7 04/21/2016 0527   CL 101 04/21/2016 0527   CO2 29 04/21/2016 0527   GLUCOSE 142 (H) 04/21/2016 0527   BUN 19 04/21/2016 0527   CREATININE 1.40 (H) 04/21/2016 0527   CREATININE 1.19 09/07/2012 1236   CALCIUM 9.5 04/21/2016 0527   PROT 7.5 04/17/2016 0345   ALBUMIN 3.4 (L) 04/17/2016 0345   AST 20 04/17/2016 0345   ALT 19 04/17/2016 0345   ALKPHOS 106 04/17/2016 0345   BILITOT 0.5 04/17/2016 0345   GFRNONAA 59 (L) 04/21/2016 0527   GFRAA >60 04/21/2016 0527   COAGS Lab Results  Component Value Date   INR 0.93 04/17/2016   Lipid Panel    Component Value Date/Time   CHOL 198 04/17/2016 1012   TRIG 91 04/17/2016 1012   HDL 60 04/17/2016 1012   CHOLHDL 3.3 04/17/2016 1012   VLDL 18 04/17/2016 1012   LDLCALC 120 (H) 04/17/2016 1012   HgbA1C  Lab Results  Component Value Date   HGBA1C 8.7 (H) 04/17/2016   Cardiac Panel (last 3 results) No results for input(s): CKTOTAL, CKMB, TROPONINI, RELINDX in the last 72 hours. Urinalysis    Component Value Date/Time   COLORURINE YELLOW 04/17/2016 0340   APPEARANCEUR CLEAR 04/17/2016 0340   LABSPEC 1.010 04/17/2016 0340  PHURINE 6.0 04/17/2016 0340   GLUCOSEU 250 (A) 04/17/2016 0340   HGBUR TRACE (A) 04/17/2016 0340   BILIRUBINUR NEGATIVE 04/17/2016 0340   BILIRUBINUR neg 09/07/2012 1209   KETONESUR NEGATIVE 04/17/2016 0340   PROTEINUR 30 (A) 04/17/2016 0340   UROBILINOGEN negative 09/07/2012 1209   NITRITE NEGATIVE 04/17/2016 0340   LEUKOCYTESUR NEGATIVE 04/17/2016 0340   Urine Drug Screen     Component Value Date/Time   LABOPIA NONE DETECTED 04/17/2016 0420   COCAINSCRNUR NONE DETECTED 04/17/2016 0420   LABBENZ NONE DETECTED 04/17/2016 0420   AMPHETMU NONE DETECTED 04/17/2016 0420   THCU NONE DETECTED 04/17/2016 0420   LABBARB NONE DETECTED 04/17/2016 0420    Alcohol Level    Component Value Date/Time   [facility] 04/17/2016 0345     SIGNIFICANT DIAGNOSTIC STUDIES  Mr Laqueta JeanBrain W Wo  Contrast 04/21/2016 No acute thrombotic infarct. 1.8 cm left thalamic hematoma. Blood breakdown products within the right thalamus may indicate result of prior hemorrhage with blood-stained cleft. Tiny blood breakdown products within the cerebellum and pontomedullary junction suggestive of prior episodes hemorrhagic ischemia. Remote tiny bilateral corona radiata infarcts and remote tiny right periatrial infarct. Moderate patchy and punctate white matter changes most likely related to result of chronic microvascular changes in the present clinical setting. Atrophy without hydrocephalus. Sulcal prominence greatest posterior left frontal-parietal region felt most likely to be related to atrophy rather than arachnoid cyst. Mucosal thickening maxillary sinuses greater on left measuring up to 8 mm. Minimal mucosal thickening ethmoid sinus air cells bilaterally. Heterogeneous appearance of the parotid gland bilaterally may indicate changes of result of underlying inflammatory process such as Sjogren's disease.   Mr [name] 04/21/2016 Major dural sinuses are patent with dominant drainage to the right. Of note is that the internal cerebral veins are patent.   Ct Chest W Contrast 1. Thoracic findings which are most consistent with sarcoidosis. Adenopathy with perilymphatic distribution upper lobe nodularity. 2. Mild upper abdominal adenopathy which may also represent sarcoidosis. Abdominopelvic imaging may be informative for further evaluation. 3. Right adrenal nodule is strongly favored (but not diagnostic of) an adenoma. If the patient undergoes abdominal imaging, consider precontrast evaluation of the abdomen. If not, follow-up with adrenal protocol CT at 1 year should be considered. 4. Cardiomegaly with left ventricular hypertrophy. 5. Aberrant right subclavian artery.   Ct Angio Head and Neck W Or Wo Contrast 04/17/2016 Significant motion degradation. Intracranial component of the examination is of  borderline diagnostic utility.  Stable LEFT thalamic hemorrhage, without progression.  No intracranial vascular malformation or deep venous thrombosis.  RIGHT thalamic abnormality less impressive on CTA.  Query developmental venous anomaly accounting for linear hyperdensity, versus calcifications. If MRI brain is performed, consider formal MR venogram postcontrast to further assess. No extracranial or intracranial stenosis or dissection. Parenchymal interstitial prominence, in conjunction with bulky adenopathy, suggesting sarcoidosis.  Consider chest CT with contrast for further evaluation.   Ct Head Wo Contrast 04/17/2016 1. Unchanged appearance of the left posterior thalamic focus of acute intraparenchymal hemorrhage, most likely of a hypertensive etiology. No new midline shift or mass effect. No intraventricular extension of blood or hydrocephalus.  2. Unchanged linear hyperdensity within the posterior right thalamus, possibly an area of mineralization or a prominent vein, such as a developmental venous anomaly.  3. No new areas of hemorrhage.   Ct Head Code Stroke Wo Contrast` 04/17/2016 1. 21 x 16 x 19 mm left thalamic hemorrhage (estimated volume 3.2 cc). Mild localized edema without  significant mass effect. No intraventricular extension.  2. Smaller 16 x 3 x 5 mm acute right thalamic hemorrhage without significant mass effect or intraventricular extension. Hypertensive etiology is suspected.  3. Mild atrophy with chronic microvascular ischemic disease.   2D Echocardiogram  pending      HISTORY OF PRESENT ILLNESS Vivek Tottenis an 45 y.o.malewith a history of diabetes mellitus and hypertension and noncompliancewith treatment who [facility], speech change and right-sidedweakness.Code stroke was activated. Blood pressure was markedly elevated. CT scan of his head showed an acute left thalamic hemorrhage, as well as a  smaller acute right thalamic hemorrhage. There was no ventricular extension of hemorrhage. Minimal localized edema was noted on the left. Blood pressure was 233/139on admission to the ED. He was given labetalol and subsequently started on Cardene drip. [facility]. NIH stroke score was 2 time of this evaluation. Patient was not administered IV t-PA secondary to ICH. Hewas admitted to the neuro ICU for further evaluation and treatment. [facility]    HOSPITAL COURSE Mr. Grayland OrmondBobby Mikhail is a 45 y.o. male with history of HTN and T2DM presenting with confusion and found to have bilateral right and left sided thalamic hemorrhages. He did not receive IV t-PA due to hemorrhage.   Bilateral Acute Thalamic Hemorrhagic Stroke: Likely secondary to uncontrolled hypertension due to noncompliance  Resultant no deficits  CT head: 21 x 16 x 19 mm left thalamic hemorrhage (estimated volume 3.2 cc). Mild localized edema without significant mass effect. No intraventricular extension. Smaller 16 x 3 x 5 mm acute right thalamic hemorrhage without significant mass effect or intraventricular extension. Hypertensive etiology is suspected. Mild atrophy with chronic microvascular ischemic disease.  CTA head and neck:  Stable LEFT thalamic hemorrhage, without progression. No intracranial vascular malformation or deep venous thrombosis. RIGHT thalamic abnormality less impressive on CTA. Query developmental venous anomaly accounting for linear hyperdensity, versus calcifications. If MRI brain is performed, consider formal MR venogram postcontrast to further assess. No extracranial or intracranial stenosis or dissection.   MRI brain with and without contrast pending   MRV pending   2D Echo  pending   LDL 120  HgbA1c - 8.7  No antithrombotic prior to admission, now on No antithrombotic due to hemorrhage  Ongoing aggressive stroke risk factor management  Therapy recommendations:  no therapy needs   Disposition:  discharge home  Hypertensive Urgency  Treated in the ICU with IV antihypertensives - Patient was maximized on Cardene, not switched to Cleviprex  Added labetalol 200 mg TID and amlodipine 10 mg daily  Lisinopril 10 mg daily added 04/19/2016   BP improving by discharge  Long-term BP goal normotensive   Hyperlipidemia  Home meds:  None  LDL 120, goal < 70  Add atorvastatin 40 mg QD at time of discharge  Diabetes  Not on meds PTA  HgbA1c 8.7, goal < 7.0  Metformin 500 bid added at discharge. GFR 59  Other Stroke Risk Factors  Obesity, Body mass index is 31.48 kg/m., recommend weight loss, diet and exercise as appropriate   Other Active Problems  Parenchymal interstitial prominence, in conjunction with bulky adenopathy, suggesting sarcoidosis. Chest CT confirmed likely sarcoid. Also, possible adrenal adenoma that will need followup. Referred to [facility] for follow up 05/04/16 at 945a  Mild Anemia, resolved - 12.6 / 37.6 -> 13.0  Elevated Creatinine - 1.38 -> 1.30 -> 1.16 -> 1.4  Hypokalemia, resolved -  3.1 -> supplement -> recheck -> 3.7   DISCHARGE EXAM Blood pressure (!) 164/92, pulse 78, temperature 98.3 F (36.8 C), temperature source Oral, resp. rate 18, height 6' (1.829 m), weight 105.3 kg (232 lb 2.3 oz), SpO2 96 %. General: Vital signs reviewed.  Patient is well-developed and well-nourished, in no acute distress and cooperative with exam.  Head: Normocephalic and atraumatic. Eyes: EOMI, conjunctivae normal, PERRLA.  Neck: Supple, trachea midline Cardiovascular: RRR Pulmonary/Chest: Clear to anterior auscultation bilaterally Abdominal: Soft, non-tender, non-distended, BS + Extremities: No lower extremity edema bilaterally Neurological: A&O x3, Naming intact, repetition intact. Follows simple and 2-step commands. Finger to nose intact. Strength is normal and symmetric bilaterally, face symmetric, normal tongue  extension, PEERLA, EOMI, visual fields intact, no focal motor deficit, sensory intact to light touch bilaterally.    Discharge Diet   Diet heart healthy/carb modified Room service appropriate? Yes; Fluid consistency: Thin liquids  DISCHARGE PLAN  Disposition:  Home  Ongoing risk factor control by Primary Care Physician at time of discharge  Follow-up HUSAIN,KARRAR, MD in 2 weeks.  Follow-up Dr. Isaiah SergeMannam 05/04/2016 at 945a, pulmonologist given sarcoidosis  Follow-up with Dr. Delia HeadyPramod Rayvon Dakin, Stroke Clinic in 6 weeks, office to schedule an appointment.  45 minutes were spent preparing discharge.  Rhoderick MoodyBIBY,SHARON  [facility] See Amion for Pager information 04/21/2016 3:52 PM  I have personally examined this patient, reviewed notes, independently viewed imaging studies, participated in medical decision making and plan of care.ROS completed by me personally and pertinent positives fully documented  I have made any additions or clarifications directly to the above note. Agree with note above.   Delia HeadyPramod Hazell Siwik, MD Medical Director [facility] Pager: [phone] 04/23/2016 7:17 PM

## 2016-04-21 NOTE — Evaluation (Signed)
Occupational Therapy Evaluation Patient Details Name: Danny OrmondBobby Stevens MRN: [id number] DOB: 1970/08/31 Today's Date: 04/21/2016    History of Present Illness Patient is a 45 y/o male with hx of DM, HTN, noncompliance with treatment who [facility] with acute onset of confusion, speech change and right-sided weakness. NIH 2. CT- acute left thalamic infarction, as well as a smaller acute right thalamic infarction   Clinical Impression   Pt demonstrated independence in ADL, ADL transfers and mobility. Cognition appears intact. No further OT needs. Deferred pt's question of whether he may drive and return to work to MD.    Follow Up Recommendations  No OT follow up    Equipment Recommendations  None recommended by OT    Recommendations for Other Services       Precautions / Restrictions Precautions Precautions: None Restrictions Weight Bearing Restrictions: No      Mobility Bed Mobility Overal bed mobility: Independent             General bed mobility comments: hob flat  Transfers Overall transfer level: Independent Equipment used: None                  Balance     Sitting balance-Leahy Scale: Normal       Standing balance-Leahy Scale: Good                              ADL Overall ADL's : Independent                                             Vision     Perception     Praxis      Pertinent Vitals/Pain Pain Assessment: No/denies pain     Hand Dominance Left   Extremity/Trunk Assessment Upper Extremity Assessment Upper Extremity Assessment: Overall WFL for tasks assessed   Lower Extremity Assessment Lower Extremity Assessment: Overall WFL for tasks assessed   Cervical / Trunk Assessment Cervical / Trunk Assessment: Normal   Communication Communication Communication: No difficulties   Cognition Arousal/Alertness: Awake/alert Behavior During Therapy: WFL for tasks  assessed/performed Overall Cognitive Status: Within Functional Limits for tasks assessed                     General Comments       Exercises       Shoulder Instructions      Home Living Family/patient expects to be discharged to:: Private residence Living Arrangements: Spouse/significant other;Children Available Help at Discharge: Family;Available PRN/intermittently Type of Home: House Home Access: Level entry     Home Layout: Two level;Bed/bath upstairs Alternate Level Stairs-Number of Steps: 1 level   Bathroom Shower/Tub: Producer, television/film/videoWalk-in shower   Bathroom Toilet: Standard     Home Equipment: None      Lives With: Spouse;Other (Comment) (children are 12 and 7)    Prior Functioning/Environment Level of Independence: Independent        Comments: 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        OT Problem List: Decreased safety awareness   OT Treatment/Interventions:      OT Goals(Current goals can be found in the care plan section) Acute Rehab OT Goals Patient Stated Goal: return home   OT Frequency:     Barriers to D/C:  Co-evaluation              End of Session    Activity Tolerance: Patient tolerated treatment well Patient left: with call bell/phone within reach;in bed   Time: 1007-1020 OT Time Calculation (min): 13 min Charges:  OT General Charges $OT Visit: 1 Procedure OT Evaluation $OT Eval Low Complexity: 1 Procedure G-Codes:    Danny Stevens, Danny Stevens Danny Stevens 04/21/2016, 10:24 AM  [phone]

## 2016-04-29 DIAGNOSIS — I10 Essential (primary) hypertension: Secondary | ICD-10-CM | POA: Diagnosis not present

## 2016-05-04 ENCOUNTER — Ambulatory Visit (INDEPENDENT_AMBULATORY_CARE_PROVIDER_SITE_OTHER): Payer: 59 | Admitting: Pulmonary Disease

## 2016-05-04 ENCOUNTER — Encounter: Payer: Self-pay | Admitting: Pulmonary Disease

## 2016-05-04 VITALS — BP 152/110 | HR 84 | Ht 72.0 in | Wt 234.6 lb

## 2016-05-04 DIAGNOSIS — D869 Sarcoidosis, unspecified: Secondary | ICD-10-CM | POA: Diagnosis not present

## 2016-05-04 NOTE — Patient Instructions (Addendum)
We'll schedule you for a follow-up CT of the chest high res in 3 months. Return to clinic after scan for review and plan for further treatment.

## 2016-05-04 NOTE — Progress Notes (Signed)
Danny OrmondBobby Stevens    [id number]    [birth year]  Primary Care Physician:HUSAIN,KARRAR, MD  Referring Physician: Georgann HousekeeperKarrar Husain, MD [address] [place], [place] [address]  Chief complaint:  Consult for evaluation of sarcoidosis  HPI: [name] is a 46 year old with past medical history of diabetes, hypertension. He was hospitalized from 04/17/16-04/21/16 with hypertensive emergency, thalamic intracranial hemorrhage. He had a CT of the chest at that time which showed mediastinal adenopathy, right paratracheal adenopathy, hilar adenopathy and upper lobe perilymphatic nodularity suggestive of sarcoidosis. He has been referred to pulmonary clinic for further evaluation.  He denies any respiratory symptoms currently. He does not have any cough, sputum production, dyspnea, wheezing, chest pain, palpitations. He has been started on multiple medications for better control office blood pressure including Norvasc, lisinopril, labetalol. His blood pressure still continues to be high and he was recently started on hydralazine by his primary care physician Dr. Eula ListenHussain.  He is a never smoker. 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  Outpatient Encounter Prescriptions as of 05/04/2016  Medication Sig  . amLODipine (NORVASC) 10 MG tablet Take 1 tablet (10 mg total) by mouth daily.  Marland Kitchen. atorvastatin (LIPITOR) 40 MG tablet Take 1 tablet (40 mg total) by mouth daily at 6 PM.  . labetalol (NORMODYNE) 200 MG tablet Take 2 tablets (400 mg total) by mouth 2 (two) times daily.  Marland Kitchen. lisinopril (PRINIVIL,ZESTRIL) 20 MG tablet Take 1 tablet (20 mg total) by mouth daily.  . metFORMIN (GLUCOPHAGE) 500 MG tablet Take 1 tablet (500 mg total) by mouth 2 (two) times daily with a meal.  . Multiple Vitamin (MULTIVITAMIN WITH MINERALS) TABS tablet Take 1 tablet by mouth daily.   No facility-administered encounter medications on file as of 05/04/2016.     Allergies as of  05/04/2016  . (No Known Allergies)    Past Medical History:  Diagnosis Date  . Diabetes mellitus without complication (HCC)   . Hypertension     Past Surgical History:  Procedure Laterality Date  . VASECTOMY  2013    Family History  Problem Relation Age of Onset  . Diabetes Mother   . Hypertension Mother   . Stroke Mother     TIA  . Hypertension Father   . Cancer Neg Hx   . Heart disease Neg Hx     Social History   Social History  . Marital status: Married    Spouse name: N/A  . Number of children: N/A  . Years of education: N/A   Occupational History  . Not on file.   Social History Main Topics  . Smoking status: Never Smoker  . Smokeless tobacco: Never Used  . Alcohol use No  . Drug use: No  . Sexual activity: Not on file   Other Topics Concern  . Not on file   Social History Narrative   Married, 9yo and 4yo sons.   Exercise - walks some, running after kids.   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     Review of systems: Review of Systems  Constitutional: Negative for fever and chills.  HENT: Negative.   Eyes: Negative for blurred vision.  Respiratory: as per HPI  Cardiovascular: Negative for chest pain and palpitations.  Gastrointestinal: Negative for vomiting, diarrhea, blood per rectum. Genitourinary: Negative for dysuria, urgency, frequency and hematuria.  Musculoskeletal: Negative for myalgias, back pain and joint pain.  Skin: Negative for itching and rash.  Neurological: Negative for dizziness, tremors, focal weakness, seizures and loss of consciousness.  Endo/Heme/Allergies: Negative for environmental allergies.  Psychiatric/Behavioral: Negative for depression, suicidal ideas and hallucinations.  All other systems reviewed and are negative.  Physical Exam: Blood pressure (!) 152/110, pulse 84, height 6' (1.829 m), weight 234 lb 9.6 oz (106.4 kg), SpO2 97 %. Gen:      No acute distress HEENT:  EOMI, sclera anicteric Neck:     No masses; no  thyromegaly Lungs:    Clear to auscultation bilaterally; normal respiratory effort CV:         Regular rate and rhythm; no murmurs Abd:      + bowel sounds; soft, non-tender; no palpable masses, no distension Ext:    No edema; adequate peripheral perfusion Skin:      Warm and dry; no rash Neuro: alert and oriented x 3 Psych: normal mood and affect  Data Reviewed: CT chest with contrast 04/19/16- mediastinal, hilar lymphadenopathy. Upper lobe perilymphatic nodularity. All images personally reviewed.  CBC    Component Value Date/Time   WBC 5.7 04/21/2016 0527   RBC 4.72 04/21/2016 0527   HGB 13.3 04/21/2016 0527   HCT 41.0 04/21/2016 0527   PLT 230 04/21/2016 0527   MCV 86.9 04/21/2016 0527   MCH 28.2 04/21/2016 0527   MCHC 32.4 04/21/2016 0527   RDW 13.7 04/21/2016 0527   LYMPHSABS 1.4 04/17/2016 0345   MONOABS 0.6 04/17/2016 0345   EOSABS 0.2 04/17/2016 0345   BASOSABS 0.0 04/17/2016 0345   BMET    Component Value Date/Time   NA 140 04/21/2016 0527   K 3.7 04/21/2016 0527   CL 101 04/21/2016 0527   CO2 29 04/21/2016 0527   GLUCOSE 142 (H) 04/21/2016 0527   BUN 19 04/21/2016 0527   CREATININE 1.40 (H) 04/21/2016 0527   CREATININE 1.19 09/07/2012 1236   CALCIUM 9.5 04/21/2016 0527   GFRNONAA 59 (L) 04/21/2016 0527   GFRAA >60 04/21/2016 0527   Assessment:  Assessment for abnormal CT scan. CT scan reviewed which shows pulmonary parenchymal nodularity with mediastinal, hilar lymphadenopathy that may represent sarcoidosis. There is no evidence of malignancy. He will probably need an EBUS with biopsy of the lymph nodes for further diagnosis. However we will hold off on the procedure right now as he is currently asymptomatic from a pulmonary perspective and is recovering from his recent hospitalization with hypertensive emergency and stroke. He continue to have issues with high BP and HTN meds are being adjusted by his PCP.  We will revaluate with a repeat CT scan in 3  months. If the abnormalities persist then we will proceed with the bronchoscope.   Plan/Recommendations: - Follow up CT scan in 3 months.   Chilton Greathouse MD [facility] Pager [phone] 05/04/2016, 10:19 AM  CC: Georgann Housekeeper, MD

## 2016-05-25 DIAGNOSIS — I10 Essential (primary) hypertension: Secondary | ICD-10-CM | POA: Diagnosis not present

## 2016-05-25 DIAGNOSIS — E1165 Type 2 diabetes mellitus with hyperglycemia: Secondary | ICD-10-CM | POA: Diagnosis not present

## 2016-06-08 ENCOUNTER — Ambulatory Visit: Payer: Self-pay | Admitting: Neurology

## 2016-06-10 ENCOUNTER — Telehealth: Payer: Self-pay

## 2016-06-10 NOTE — Telephone Encounter (Signed)
PATIENT WAS A NO SHOW FOR APPOINTMENT ON 06/08/2016. 

## 2016-06-12 ENCOUNTER — Encounter: Payer: Self-pay | Admitting: Neurology

## 2016-06-29 DIAGNOSIS — I10 Essential (primary) hypertension: Secondary | ICD-10-CM | POA: Diagnosis not present

## 2016-07-23 ENCOUNTER — Ambulatory Visit (INDEPENDENT_AMBULATORY_CARE_PROVIDER_SITE_OTHER): Payer: 59 | Admitting: Neurology

## 2016-07-23 ENCOUNTER — Encounter: Payer: Self-pay | Admitting: Neurology

## 2016-07-23 VITALS — BP 157/84 | HR 85 | Ht 72.0 in | Wt 229.8 lb

## 2016-07-23 DIAGNOSIS — I610 Nontraumatic intracerebral hemorrhage in hemisphere, subcortical: Secondary | ICD-10-CM | POA: Diagnosis not present

## 2016-07-23 NOTE — Patient Instructions (Addendum)
I had a long d/w patient about his recent hemorrhagic stroke, risk for recurrent stroke/TIAs, personally independently reviewed imaging studies and stroke evaluation results and answered questions.Start aspirin 81 mg daily  for secondary stroke prevention and maintain strict control of hypertension with blood pressure goal below 130/90, diabetes with hemoglobin A1c goal below 6.5% and lipids with LDL cholesterol goal below 70 mg/dL. I also advised the patient to eat a healthy diet with plenty of whole grains, cereals, fruits and vegetables, exercise regularly and maintain ideal body weight Followup in the future with my nurse practitioner in 6 months or call earlier if necessary     \\\     Stroke Prevention Some medical conditions and behaviors are associated with an increased chance of having a stroke. You may prevent a stroke by making healthy choices and managing medical conditions. How can I reduce my risk of having a stroke?  Stay physically active. Get at least 30 minutes of activity on most or all days.  Do not smoke. It may also be helpful to avoid exposure to secondhand smoke.  Limit alcohol use. Moderate alcohol use is considered to be:  No more than 2 drinks per day for men.  No more than 1 drink per day for nonpregnant women.  Eat healthy foods. This involves:  Eating 5 or more servings of fruits and vegetables a day.  Making dietary changes that address high blood pressure (hypertension), high cholesterol, diabetes, or obesity.  Manage your cholesterol levels.  Making food choices that are high in fiber and low in saturated fat, trans fat, and cholesterol may control cholesterol levels.  Take any prescribed medicines to control cholesterol as directed by your health care provider.  Manage your diabetes.  Controlling your carbohydrate and sugar intake is recommended to manage diabetes.  Take any prescribed medicines to control diabetes as directed by your health  care provider.  Control your hypertension.  Making food choices that are low in salt (sodium), saturated fat, trans fat, and cholesterol is recommended to manage hypertension.  Ask your health care provider if you need treatment to lower your blood pressure. Take any prescribed medicines to control hypertension as directed by your health care provider.  If you are 44-18 years of age, have your blood pressure checked every 3-5 years. If you are 32 years of age or older, have your blood pressure checked every year.  Maintain a healthy weight.  Reducing calorie intake and making food choices that are low in sodium, saturated fat, trans fat, and cholesterol are recommended to manage weight.  Stop drug abuse.  Avoid taking birth control pills.  Talk to your health care provider about the risks of taking birth control pills if you are over 11 years old, smoke, get migraines, or have ever had a blood clot.  Get evaluated for sleep disorders (sleep apnea).  Talk to your health care provider about getting a sleep evaluation if you snore a lot or have excessive sleepiness.  Take medicines only as directed by your health care provider.  For some people, aspirin or blood thinners (anticoagulants) are helpful in reducing the risk of forming abnormal blood clots that can lead to stroke. If you have the irregular heart rhythm of atrial fibrillation, you should be on a blood thinner unless there is a good reason you cannot take them.  Understand all your medicine instructions.  Make sure that other conditions (such as anemia or atherosclerosis) are addressed. Get help right away if:  You  have sudden weakness or numbness of the face, arm, or leg, especially on one side of the body.  Your face or eyelid droops to one side.  You have sudden confusion.  You have trouble speaking (aphasia) or understanding.  You have sudden trouble seeing in one or both eyes.  You have sudden trouble  walking.  You have dizziness.  You have a loss of balance or coordination.  You have a sudden, severe headache with no known cause.  You have new chest pain or an irregular heartbeat. Any of these symptoms may represent a serious problem that is an emergency. Do not wait to see if the symptoms will go away. Get medical help at once. Call your local emergency services (911 in [place] Do not drive yourself to the hospital. This information is not intended to replace advice given to you by your health care provider. Make sure you discuss any questions you have with your health care provider. Document Released: 05/21/2004 Document Revised: 09/19/2015 Document Reviewed: 10/14/2012 Elsevier Interactive Patient Education  2017 [employer]

## 2016-07-23 NOTE — Progress Notes (Signed)
[facility] [address] [place]. [place] [address] [phone]       OFFICE FOLLOW-UP NOTE  Danny. Danny Stevens Date of Birth:  09/16/1970 Medical Record Number:  [id number]   HPI:  Danny Stevens is a 46 year old African-American male seen today for the first of his follow-up visit following hospital admission for intracerebral hemorrhage in 04/17/16. History is obtained from patient and review of Hospital medical records as well as I have personally reviewed imaging studies.Danny Tottenis an 46 y.o.malewith a history of diabetes mellitus and hypertension and noncompliancewith treatment who presented to the ED at [facility] onset of confusion, speech change and right-sidedweakness.Code stroke was activated. Blood pressure was markedly elevated. CT scan of his head showed an acute left thalamic hemorrhage, as well as a smaller acute right thalamic hemorrhage. There was no ventricular extension of hemorrhage. Minimal localized edema was noted on the left. Blood pressure was 233/139on admission to the ED. He was given labetalol and subsequently started on Cardene drip. He was transferred to [facility] for further management. NIH stroke score was 2 time of this evaluation. Patient was not administered IV t-PA secondary to ICH. Hewas admitted to the neuro ICU for further evaluation and treatment. CT scan of the head on admission showed 21 x 16 x 19 mm left thalamic hemorrhage with estimated volume of 3.2 cubic cc with mild localized edema without significant mass effect or intraventricular extension. There was a smaller 16 x 3 x 5 mm right thalamic hemorrhage as well. MRI scan of the brain confirmed the thalamic hemorrhage and did not show any acute infarct. Remote age tiny bilateral carotid A. tach and a tiny right periatrial infarcts were noted. The changes of chronic small vessel disease. MRV of the brain showed patent large cerebral venous sinuses. Urine drug screen was  negative. Transthoracic echo showed normal ejection fraction. LDL cholesterol was 120 mg percent and with any malignancy was elevated at 8.7. Patient required IV hypertensives in the ICU initially Cardene and switched to Trilipix and started on amlodipine and labetalol and lisinopril. He did very well and was seen by physical occupational therapy and was felt to have no therapy needs and was discharged home. He states his done well since discharge without any residual neurological deficits. 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 His blood pressure is however still running high today it is 150/8 for. He has been following up closely with his primary physician Dr. Antony Blackbird whose been working with his blood pressure medications. He has no complaints today.  ROS:   14 system review of systems is positive for  no complaints today and all systems negative PMH:  Past Medical History:  Diagnosis Date  . Diabetes mellitus without complication (HCC)   . Hypertension   . Stroke [facility])     Social History:  Social History   Social History  . Marital status: Married    Spouse name: N/A  . Number of children: N/A  . Years of education: N/A   Occupational History  . Not on file.   Social History Main Topics  . Smoking status: Never Smoker  . Smokeless tobacco: Never Used  . Alcohol use No  . Drug use: No  . Sexual activity: Not on file   Other Topics Concern  . Not on file   Social History Narrative   Married, 9yo and 4yo sons.   Exercise - walks some, running after kids.   Works for [employer],  delivery driver.      Medications:   Current Outpatient Prescriptions on File Prior to Visit  Medication Sig Dispense Refill  . amLODipine (NORVASC) 10 MG tablet Take 1 tablet (10 mg total) by mouth daily. 30 tablet 2  . atorvastatin (LIPITOR) 40 MG tablet Take 1 tablet (40 mg total) by mouth daily at 6 PM. 30 tablet 2  . labetalol (NORMODYNE) 200 MG tablet Take 2 tablets (400 mg total)  by mouth 2 (two) times daily. 60 tablet 2  . lisinopril (PRINIVIL,ZESTRIL) 20 MG tablet Take 1 tablet (20 mg total) by mouth daily. 30 tablet 2  . metFORMIN (GLUCOPHAGE) 500 MG tablet Take 1 tablet (500 mg total) by mouth 2 (two) times daily with a meal. 60 tablet 2  . Multiple Vitamin (MULTIVITAMIN WITH MINERALS) TABS tablet Take 1 tablet by mouth daily.     No current facility-administered medications on file prior to visit.     Allergies:  No Known Allergies  Physical Exam General: well developed, well nourished Middle-age African-American male, seated, in no evident distress Head: head normocephalic and atraumatic.  Neck: supple with no carotid or supraclavicular bruits Cardiovascular: regular rate and rhythm, no murmurs Musculoskeletal: no deformity Skin:  no rash/petichiae Vascular:  Normal pulses all extremities Vitals:   07/23/16 1623  BP: (!) 157/84  Pulse: 85   Neurologic Exam Mental Status: Awake and fully alert. Oriented to place and time. Recent and remote memory intact. Attention span, concentration and fund of knowledge appropriate. Mood and affect appropriate. Diminished recall 2/3. Animal naming 11. Cranial Nerves: Fundoscopic exam reveals sharp disc margins. Pupils equal, briskly reactive to light. Extraocular movements full without nystagmus. Visual fields full to confrontation. Hearing intact. Facial sensation intact. Face, tongue, palate moves normally and symmetrically.  Motor: Normal bulk and tone. Normal strength in all tested extremity muscles. Sensory.: intact to touch ,pinprick .position and vibratory sensation.  Coordination: Rapid alternating movements normal in all extremities. Finger-to-nose and heel-to-shin performed accurately bilaterally. Gait and Station: Arises from chair without difficulty. Stance is normal. Gait demonstrates normal stride length and balance . Able to heel, toe and tandem walk without difficulty.  Reflexes: 1+ and symmetric. Toes  downgoing.   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 Vascular risk factors of hypertension, diabetes, hyperlipidemia and obesity    PLAN: I had a long d/w patient about his recent hemorrhagic stroke, risk for recurrent stroke/TIAs, personally independently reviewed imaging studies and stroke evaluation results and answered questions.Start aspirin 81 mg daily  for secondary stroke prevention and maintain strict control of hypertension with blood pressure goal below 130/90, diabetes with hemoglobin A1c goal below 6.5% and lipids with LDL cholesterol goal below 70 mg/dL. I also advised the patient to eat a healthy diet with plenty of whole grains, cereals, fruits and vegetables, exercise regularly and maintain ideal body weight Followup in the future with my nurse practitioner in 6 months or call earlier if necessary Greater than 50% of time during this 25 minute visit was spent on counseling,explanation of diagnosis, planning of further management, discussion with patient and family and coordination of care Delia HeadyPramod Sethi, MD  [facility] [address] [place], [place] [id number]  Phone [phone] Fax [phone] Note: This document was prepared with digital dictation and possible smart phrase technology. Any transcriptional errors that result from this process are unintentional

## 2016-08-03 ENCOUNTER — Inpatient Hospital Stay: Admission: RE | Admit: 2016-08-03 | Payer: 59 | Source: Ambulatory Visit

## 2016-08-05 DIAGNOSIS — I619 Nontraumatic intracerebral hemorrhage, unspecified: Secondary | ICD-10-CM | POA: Diagnosis not present

## 2016-08-05 DIAGNOSIS — I10 Essential (primary) hypertension: Secondary | ICD-10-CM | POA: Diagnosis not present

## 2016-08-05 DIAGNOSIS — E1165 Type 2 diabetes mellitus with hyperglycemia: Secondary | ICD-10-CM | POA: Diagnosis not present

## 2016-08-07 ENCOUNTER — Telehealth: Payer: Self-pay | Admitting: Pulmonary Disease

## 2016-08-07 NOTE — Telephone Encounter (Signed)
Spoke with patient and informed him 4/16 appointment with PM was to review CT test results with him; however his CT is currently scheduled for 4/18. Pt was questioned about breathing status and stated it was doing fine. Being CT will occur on 4/18, appointment was moved to PM first available on 5/30 at 3:45. Pt verbalized understanding of new appointment time. New appointment time placed in outgoing mail. Nothing further is needed.

## 2016-08-10 ENCOUNTER — Ambulatory Visit: Payer: 59 | Admitting: Pulmonary Disease

## 2016-08-12 ENCOUNTER — Inpatient Hospital Stay: Admission: RE | Admit: 2016-08-12 | Payer: 59 | Source: Ambulatory Visit

## 2016-09-01 DIAGNOSIS — E119 Type 2 diabetes mellitus without complications: Secondary | ICD-10-CM | POA: Diagnosis not present

## 2016-09-23 ENCOUNTER — Ambulatory Visit: Payer: 59 | Admitting: Pulmonary Disease

## 2017-01-11 DIAGNOSIS — Z1389 Encounter for screening for other disorder: Secondary | ICD-10-CM | POA: Diagnosis not present

## 2017-01-11 DIAGNOSIS — Z125 Encounter for screening for malignant neoplasm of prostate: Secondary | ICD-10-CM | POA: Diagnosis not present

## 2017-01-11 DIAGNOSIS — I10 Essential (primary) hypertension: Secondary | ICD-10-CM | POA: Diagnosis not present

## 2017-01-11 DIAGNOSIS — I619 Nontraumatic intracerebral hemorrhage, unspecified: Secondary | ICD-10-CM | POA: Diagnosis not present

## 2017-01-11 DIAGNOSIS — E7800 Pure hypercholesterolemia, unspecified: Secondary | ICD-10-CM | POA: Diagnosis not present

## 2017-01-11 DIAGNOSIS — E1165 Type 2 diabetes mellitus with hyperglycemia: Secondary | ICD-10-CM | POA: Diagnosis not present

## 2017-01-11 DIAGNOSIS — Z23 Encounter for immunization: Secondary | ICD-10-CM | POA: Diagnosis not present

## 2017-01-22 NOTE — Progress Notes (Deleted)
[facility]  PATIENT: Danny Stevens DOB: [birth year]   REASON FOR VISIT: *** HISTORY FROM:    HISTORY OF PRESENT ILLNESS: 07/23/16 PSMr Lalani is a 46 year old African-American male seen today for the first of his follow-up visit following hospital admission for intracerebral hemorrhage in 04/17/16. History is obtained from patient and review of Hospital medical records as well as I have personally reviewed imaging studies.Shawn Tottenis an 46 y.o.malewith a history of diabetes mellitus and hypertension and noncompliancewith treatment who presented to the ED at [facility] onset of confusion, speech change and right-sidedweakness.Code stroke was activated. Blood pressure was markedly elevated. CT scan of his head showed an acute left thalamic hemorrhage, as well as a smaller acute right thalamic hemorrhage. There was no ventricular extension of hemorrhage. Minimal localized edema was noted on the left. Blood pressure was 233/139on admission to the ED. He was given labetalol and subsequently started on Cardene drip. He was transferred to [facility] for further management. NIH stroke score was 2 time of this evaluation. Patient was not administered IV t-PA secondary to ICH. Hewas admitted to the neuro ICU for further evaluation and treatment. CT scan of the head on admission showed 21 x 16 x 19 mm left thalamic hemorrhage with estimated volume of 3.2 cubic cc with mild localized edema without significant mass effect or intraventricular extension. There was a smaller 16 x 3 x 5 mm right thalamic hemorrhage as well. MRI scan of the brain confirmed the thalamic hemorrhage and did not show any acute infarct. Remote age tiny bilateral carotid A. tach and a tiny right periatrial infarcts were noted. The changes of chronic small vessel disease. MRV of the brain showed patent large cerebral venous sinuses. Urine drug screen was negative. Transthoracic echo showed normal ejection  fraction. LDL cholesterol was 120 mg percent and with any malignancy was elevated at 8.7. Patient required IV hypertensives in the ICU initially Cardene and switched to Trilipix and started on amlodipine and labetalol and lisinopril. He did very well and was seen by physical occupational therapy and was felt to have no therapy needs and was discharged home. He states his done well since discharge without any residual neurological deficits. 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 His blood pressure is however still running high today it is 150/8 for. He has been following up closely with his primary physician Dr. Antony Blackbird whose been working with his blood pressure medications. He has no complaints today  REVIEW OF SYSTEMS: Full 14 system review of systems performed and notable only for those listed, all others are neg:  Constitutional: neg  Cardiovascular: neg Ear/Nose/Throat: neg  Skin: neg Eyes: neg Respiratory: neg Gastroitestinal: neg  Hematology/Lymphatic: neg  Endocrine: neg Musculoskeletal:neg Allergy/Immunology: neg Neurological: neg Psychiatric: neg Sleep : neg   ALLERGIES: No Known Allergies  HOME MEDICATIONS: Outpatient Medications Prior to Visit  Medication Sig Dispense Refill  . amLODipine (NORVASC) 10 MG tablet Take 1 tablet (10 mg total) by mouth daily. 30 tablet 2  . atorvastatin (LIPITOR) 40 MG tablet Take 1 tablet (40 mg total) by mouth daily at 6 PM. 30 tablet 2  . labetalol (NORMODYNE) 200 MG tablet Take 2 tablets (400 mg total) by mouth 2 (two) times daily. 60 tablet 2  . lisinopril (PRINIVIL,ZESTRIL) 20 MG tablet Take 1 tablet (20 mg total) by mouth daily. 30 tablet 2  . metFORMIN (GLUCOPHAGE) 500 MG tablet Take 1 tablet (500 mg total) by mouth 2 (two)  times daily with a meal. 60 tablet 2  . Multiple Vitamin (MULTIVITAMIN WITH MINERALS) TABS tablet Take 1 tablet by mouth daily.     No facility-administered medications prior to visit.     PAST  MEDICAL HISTORY: Past Medical History:  Diagnosis Date  . Diabetes mellitus without complication (HCC)   . Hypertension   . Stroke [facility])     PAST SURGICAL HISTORY: Past Surgical History:  Procedure Laterality Date  . VASECTOMY  2013    FAMILY HISTORY: Family History  Problem Relation Age of Onset  . Diabetes Mother   . Hypertension Mother   . Stroke Mother        TIA  . Hypertension Father   . Cancer Neg Hx   . Heart disease Neg Hx     SOCIAL HISTORY: Social History   Social History  . Marital status: Married    Spouse name: N/A  . Number of children: N/A  . Years of education: N/A   Occupational History  . Not on file.   Social History Main Topics  . Smoking status: Never Smoker  . Smokeless tobacco: Never Used  . Alcohol use No  . Drug use: No  . Sexual activity: Not on file   Other Topics Concern  . Not on file   Social History Narrative   Married, 9yo and 4yo sons.   Exercise - walks some, running after kids.   Works for [employer], [employer] fast [employer].       PHYSICAL EXAM  There were no vitals filed for this visit. There is no height or weight on file to calculate BMI.  Generalized: Well developed, in no acute distress  Head: normocephalic and atraumatic,. Oropharynx benign  Neck: Supple, no carotid bruits  Cardiac: Regular rate rhythm, no murmur  Musculoskeletal: No deformity   Neurological examination   Mentation: Alert oriented to time, place, history taking. Attention span and concentration appropriate. Recent and remote memory intact.  Follows all commands speech and language fluent.   Cranial nerve II-XII: Fundoscopic exam reveals sharp disc margins.Pupils were equal round reactive to light extraocular movements were full, visual field were full on confrontational test. Facial sensation and strength were normal. hearing was intact to finger rubbing bilaterally. Uvula tongue midline. head turning and shoulder shrug were normal and  symmetric.Tongue protrusion into cheek strength was normal. Motor: normal bulk and tone, full strength in the BUE, BLE, fine finger movements normal, no pronator drift. No focal weakness Sensory: normal and symmetric to light touch, pinprick, and  Vibration, proprioception  Coordination: finger-nose-finger, heel-to-shin bilaterally, no dysmetria Reflexes: Brachioradialis 2/2, biceps 2/2, triceps 2/2, patellar 2/2, Achilles 2/2, plantar responses were flexor bilaterally. Gait and Station: Rising up from seated position without assistance, normal stance,  moderate stride, good arm swing, smooth turning, able to perform tiptoe, and heel walking without difficulty. Tandem gait is steady  DIAGNOSTIC DATA (LABS, IMAGING, TESTING) - I reviewed patient records, labs, notes, testing and imaging myself where available.  Lab Results  Component Value Date   WBC 5.7 04/21/2016   HGB 13.3 04/21/2016   HCT 41.0 04/21/2016   MCV 86.9 04/21/2016   PLT 230 04/21/2016      Component Value Date/Time   NA 140 04/21/2016 0527   K 3.7 04/21/2016 0527   CL 101 04/21/2016 0527   CO2 29 04/21/2016 0527   GLUCOSE 142 (H) 04/21/2016 0527   BUN 19 04/21/2016 0527   CREATININE 1.40 (H) 04/21/2016 0527   CREATININE 1.19 09/07/2012  1236   CALCIUM 9.5 04/21/2016 0527   PROT 7.5 04/17/2016 0345   ALBUMIN 3.4 (L) 04/17/2016 0345   AST 20 04/17/2016 0345   ALT 19 04/17/2016 0345   ALKPHOS 106 04/17/2016 0345   BILITOT 0.5 04/17/2016 0345   GFRNONAA 59 (L) 04/21/2016 0527   GFRAA >60 04/21/2016 0527   Lab Results  Component Value Date   CHOL 198 04/17/2016   HDL 60 04/17/2016   LDLCALC 120 (H) 04/17/2016   TRIG 91 04/17/2016   CHOLHDL 3.3 04/17/2016   Lab Results  Component Value Date   HGBA1C 8.7 (H) 04/17/2016   No results found for: VITAMINB12 Lab Results  Component Value Date   TSH 1.398 09/07/2012      ASSESSMENT AND PLAN   9 -year-old African-American male with hypertensive left  thalamic intracerebral hemorrhage in December 2017) clinically quite well with no lasting deficits. Vascular risk factors of hypertension, diabetes, hyperlipidemia and obesity    PLAN: I had a long d/w patient about his recent hemorrhagic stroke, risk for recurrent stroke/TIAs, personally independently reviewed imaging studies and stroke evaluation results and answered questions.Start aspirin 81 mg daily  for secondary stroke prevention and maintain strict control of hypertension with blood pressure goal below 130/90, diabetes with hemoglobin A1c goal below 6.5% and lipids with LDL cholesterol goal below 70 mg/dL. I also advised the patient to eat a healthy diet with plenty of whole grains, cereals, fruits and vegetables, exercise regularly and maintain ideal body weight Followup in the future with my nurse practitioner in 6 months or call earlier if necessary Greater than 50% of time during this 25 minute visit was spent on counseling,explanation of diagnosis, planning of further management, discussion with patient and family and coordination of care   Nilda Riggs, [facility], [facility], APRN  [facility] [address], [address] [place], [place] [address] [phone]

## 2017-01-25 ENCOUNTER — Ambulatory Visit: Payer: 59 | Admitting: Nurse Practitioner

## 2017-01-28 IMAGING — MR MR MRV HEAD W/O CM
10 of 14 series · 27 of 48 positions shown · IV contrast (Yes MH)
Comparison: 04/17/2016 CT.

CLINICAL DATA: 45-year-old diabetic hypertensive male with
confusion, difficulty with speech and right-sided weakness. Bladder
PIPO hemorrhage. Subsequent encounter.

EXAM:
MRI HEAD WITHOUT AND WITH CONTRAST
MRV HEAD WITHOUT CONTRAST
TECHNIQUE: Multiplanar, multiecho pulse sequences of the brain and surrounding
structures were obtained without and with intravenous contrast.
Angiographic images of the intracranial venous structures were
obtained using MRV technique without intravenous contrast.
CONTRAST:  20mL MULTIHANCE GADOBENATE DIMEGLUMINE 529 MG/ML IV SOLN

[Series 2: FLAIR · sagittal · 5.0mm · 0.47mm/px · 1 of 23 slices shown (1 of 2)]
[im 1/23]
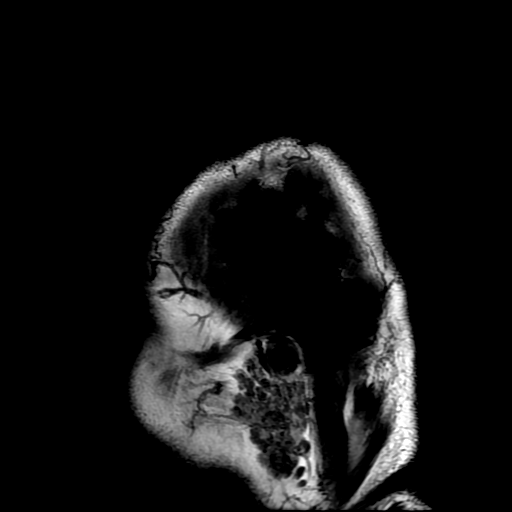

[Series 4: DWI · axial · 3.0mm · 0.94mm/px · z∈[-83,+58]mm · 5 of 96 slices shown (1 of 2)]
[im 1/96]
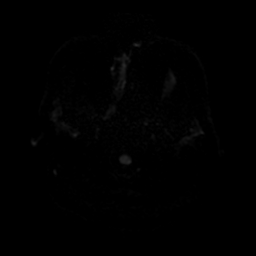
[im 24/96]
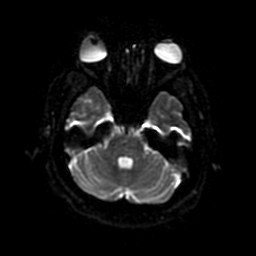
[im 48/96]
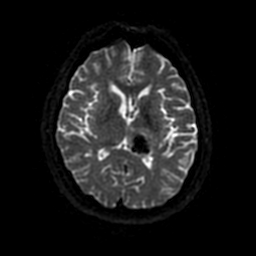
[im 72/96]
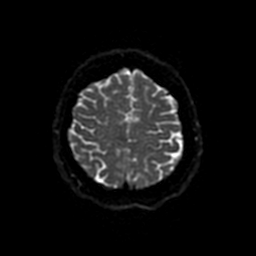
[im 96/96]
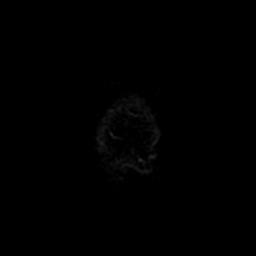

[Series 5: MRV · coronal · 1.5mm · 0.43mm/px · 4 of 116 slices shown]
[im 1/116]
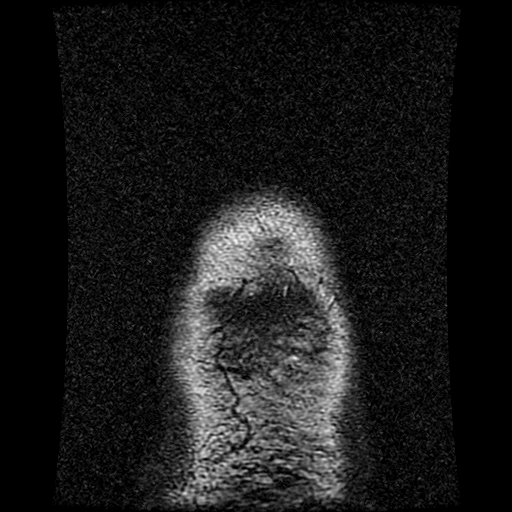
[im 20/116]
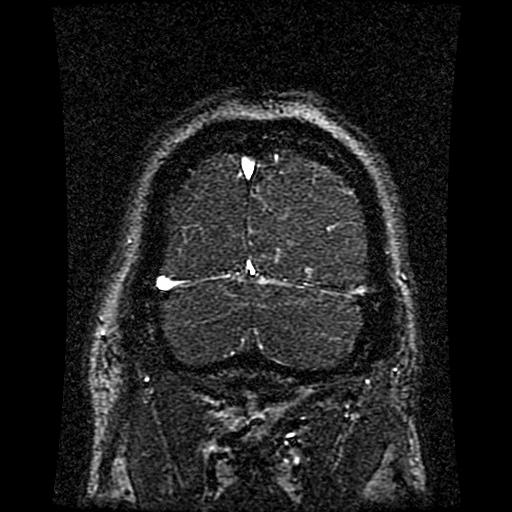
[im 39/116]
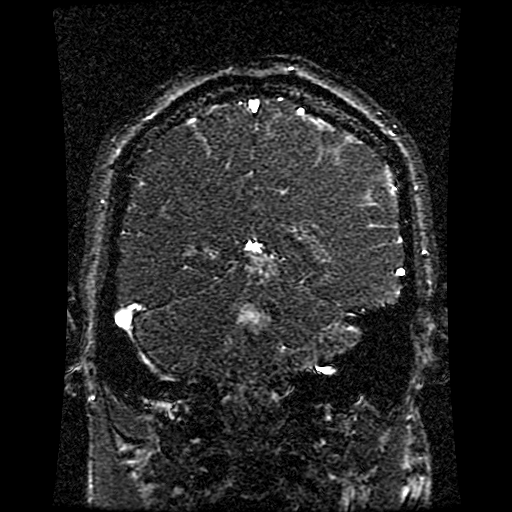
[im 58/116]
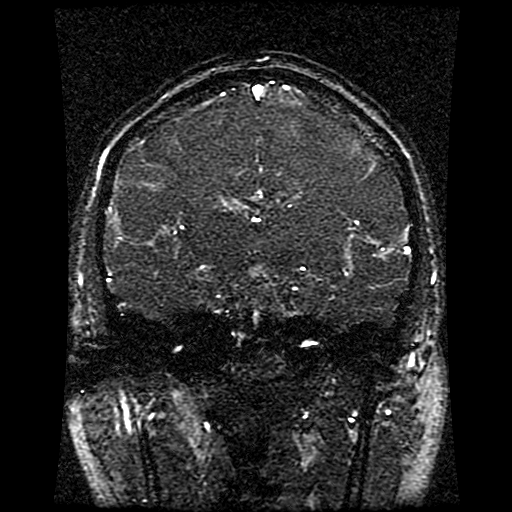

[Series 6: T2 · axial · 5.0mm · 0.47mm/px · z∈[-83,+59]mm · 2 of 25 slices shown]
[im 1/25]
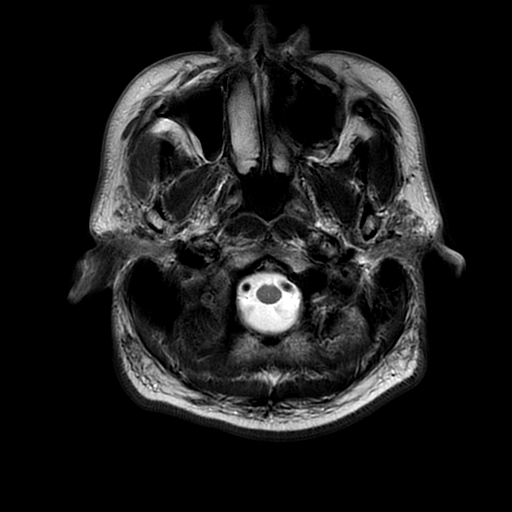
[im 25/25]
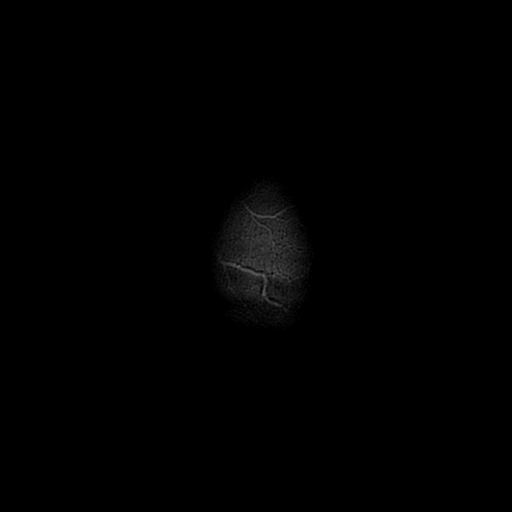

[Series 7: FLAIR · axial · 5.0mm · 0.47mm/px · z∈[-83,+59]mm · 2 of 25 slices shown (2 of 2)]
[im 1/25]
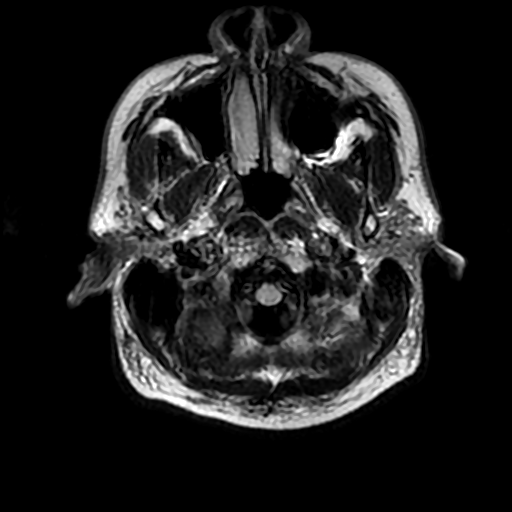
[im 25/25]
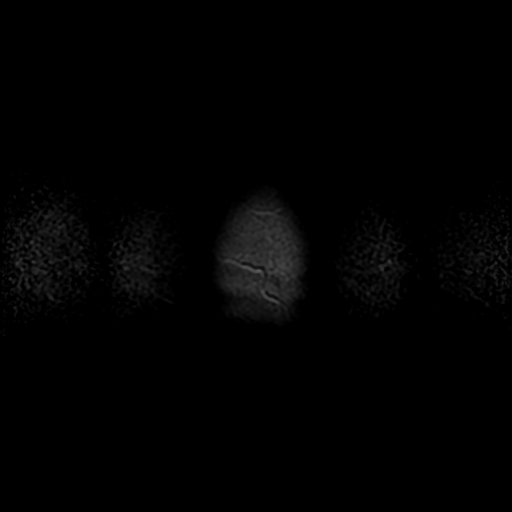

[Series 8: DWI · coronal · 4.0mm · 0.94mm/px · 4 of 66 slices shown (2 of 2)]
[im 1/66]
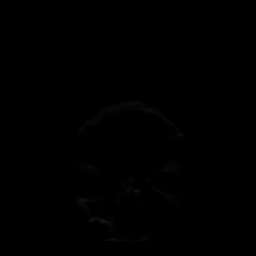
[im 22/66]
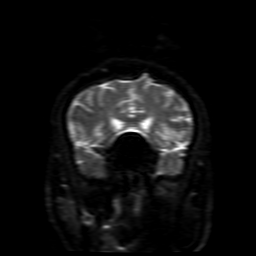
[im 44/66]
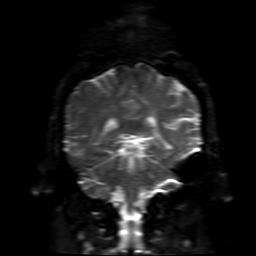
[im 66/66]
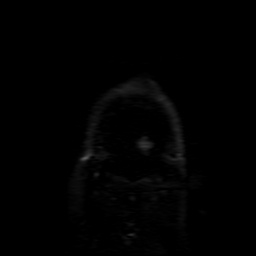

[Series 11: T2 post-contrast · coronal · 5.0mm · 0.39mm/px · 2 of 27 slices shown]
[im 1/27]
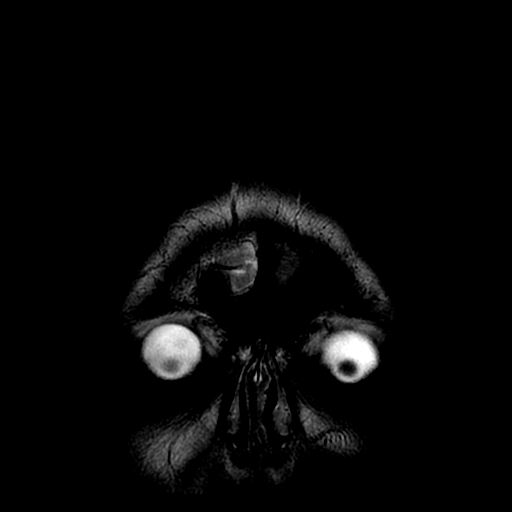
[im 27/27]
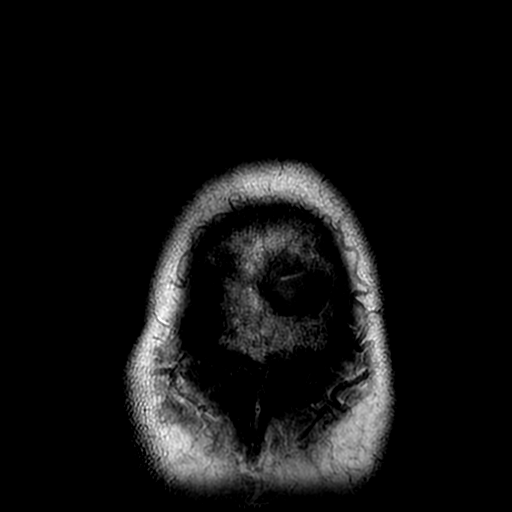

[Series 13: T1 · coronal · 5.0mm · 0.43mm/px · 2 of 28 slices shown]
[im 1/28]
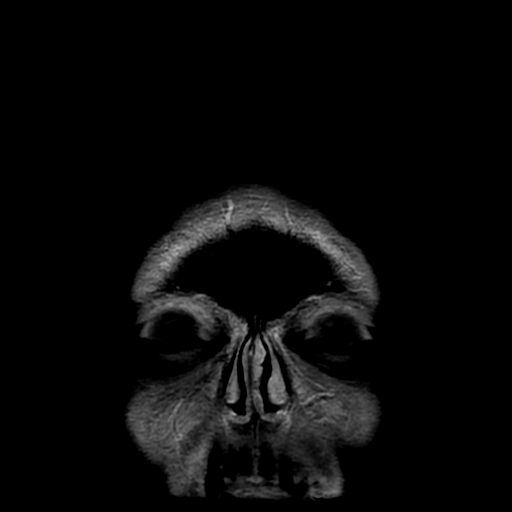
[im 28/28]
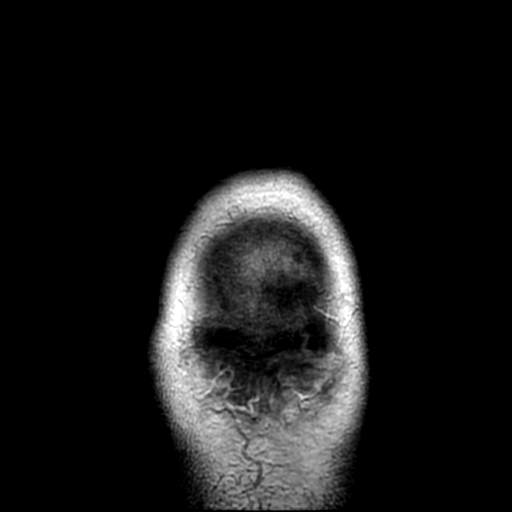

[Series 450: ADC · axial · 3.0mm · 0.94mm/px · z∈[-83,+58]mm · 3 of 48 slices shown (1 of 2)]
[im 1/48]
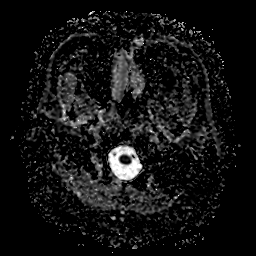
[im 24/48]
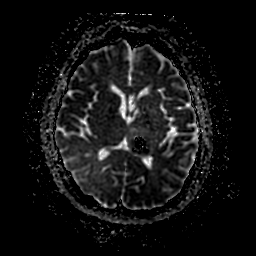
[im 48/48]
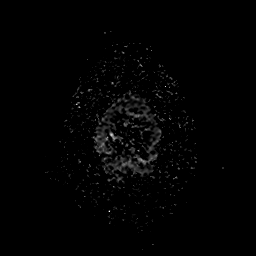

[Series 850: ADC · coronal · 4.0mm · 0.94mm/px · 2 of 33 slices shown (2 of 2)]
[im 1/33]
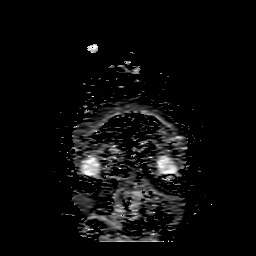
[im 33/33]
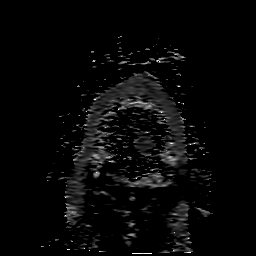

[27 of 48 positions shown; findings below may reference images not displayed]

FINDINGS: MRI HEAD WITHOUT AND WITH CONTRAST

No acute thrombotic infarct.

1.8 cm left thalamic hematoma. Blood breakdown products within the
right thalamus may indicate result of prior hemorrhage with
blood-stained cleft.

Tiny blood breakdown products within the cerebellum and
pontomedullary junction suggestive of prior episodes hemorrhagic
ischemia.

Remote tiny bilateral corona radiata infarcts and remote tiny right
periatrial infarct. Moderate patchy and punctate white matter
changes most likely related to result of chronic microvascular
changes in the present clinical setting.

No intracranial mass.

Atrophy without hydrocephalus. Sulcal prominence greatest posterior
left frontal-parietal region felt most likely to be related to
atrophy rather than arachnoid cyst.

Major intracranial vascular structures are patent. Ectatic internal
carotid arteries.

No acute orbital abnormality.

Mucosal thickening maxillary sinuses greater on left measuring up to
8 mm. Minimal mucosal thickening ethmoid sinus air cells
bilaterally.

Heterogeneous appearance of the parotid gland bilaterally may
indicate changes of result of underlying inflammatory process such
as Jaylon disease.

MRV HEAD WITHOUT CONTRAST

Artifacts slightly limits evaluation. When combined with brain MR,
major dural sinuses are patent.

Major drainage to the right.

Of note is that the internal cerebral veins are patent.
IMPRESSION: MRI HEAD WITHOUT AND WITH CONTRAST

No acute thrombotic infarct.

1.8 cm left thalamic hematoma.

Blood breakdown products within the right thalamus may indicate
result of prior hemorrhage with blood-stained cleft.

Tiny blood breakdown products within the cerebellum and
pontomedullary junction suggestive of prior episodes hemorrhagic
ischemia.

Remote tiny bilateral corona radiata infarcts and remote tiny right
periatrial infarct.

Moderate patchy and punctate white matter changes most likely
related to result of chronic microvascular changes in the present
clinical setting.

Atrophy without hydrocephalus. Sulcal prominence greatest posterior
left frontal-parietal region felt most likely to be related to
atrophy rather than arachnoid cyst.

Mucosal thickening maxillary sinuses greater on left measuring up to
8 mm. Minimal mucosal thickening ethmoid sinus air cells
bilaterally.

Heterogeneous appearance of the parotid gland bilaterally may
indicate changes of result of underlying inflammatory process such
as Jaylon disease.

MRV HEAD WITHOUT CONTRAST

Major dural sinuses are patent with dominant

drainage to the right.

Of note is that the internal cerebral veins are patent.

## 2017-03-29 NOTE — Progress Notes (Deleted)
[facility]  PATIENT: Danny Stevens DOB: 08/14/1970   REASON FOR VISIT: Follow-up for intracerebral hemorrhage April 17, 2016 HISTORY FROM:    HISTORY OF PRESENT ILLNESS:  07/23/16 PS Mr Danny Stevens is a 46 year old African-American male seen today for the first of his follow-up visit following hospital admission for intracerebral hemorrhage in 04/17/16. History is obtained from patient and review of Hospital medical records as well as I have personally reviewed imaging studies.Danny ClicheBobby Tottenis an 46 y.o.malewith a history of diabetes mellitus and hypertension and noncompliancewith treatment who [facility], speech change and right-sidedweakness.Code stroke was activated. Blood pressure was markedly elevated. CT scan of his head showed an acute left thalamic hemorrhage, as well as a smaller acute right thalamic hemorrhage. There was no ventricular extension of hemorrhage. Minimal localized edema was noted on the left. Blood pressure was 233/139on admission to the ED. He was given labetalol and subsequently started on Cardene drip. [facility]. NIH stroke score was 2 time of this evaluation. Patient was not administered IV t-PA secondary to ICH. Hewas admitted to the neuro ICU for further evaluation and treatment. CT scan of the head on admission showed 21 x 16 x 19 mm left thalamic hemorrhage with estimated volume of 3.2 cubic cc with mild localized edema without significant mass effect or intraventricular extension. There was a smaller 16 x 3 x 5 mm right thalamic hemorrhage as well. MRI scan of the brain confirmed the thalamic hemorrhage and did not show any acute infarct. Remote age tiny bilateral carotid A. tach and a tiny right periatrial infarcts were noted. The changes of chronic small vessel disease. MRV of the brain showed patent large cerebral venous sinuses. Urine drug screen  was negative. Transthoracic echo showed normal ejection fraction. LDL cholesterol was 120 mg percent and with any malignancy was elevated at 8.7. Patient required IV hypertensives in the ICU initially Cardene and switched to Trilipix and started on amlodipine and labetalol and lisinopril. He did very well and was seen by physical occupational therapy and was felt to have no therapy needs and was discharged home. He states his done well since discharge without any residual neurological deficits. 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 His blood pressure is however still running high today it is 150/8 for. He has been following up closely with his primary physician Dr. Antony BlackbirdSenn whose been working with his blood pressure medications. He has no complaints today.   REVIEW OF SYSTEMS: Full 14 system review of systems performed and notable only for those listed, all others are neg:  Constitutional: neg  Cardiovascular: neg Ear/Nose/Throat: neg  Skin: neg Eyes: neg Respiratory: neg Gastroitestinal: neg  Hematology/Lymphatic: neg  Endocrine: neg Musculoskeletal:neg Allergy/Immunology: neg Neurological: neg Psychiatric: neg Sleep : neg   ALLERGIES: No Known Allergies  HOME MEDICATIONS: Outpatient Medications Prior to Visit  Medication Sig Dispense Refill  . amLODipine (NORVASC) 10 MG tablet Take 1 tablet (10 mg total) by mouth daily. 30 tablet 2  . atorvastatin (LIPITOR) 40 MG tablet Take 1 tablet (40 mg total) by mouth daily at 6 PM. 30 tablet 2  . labetalol (NORMODYNE) 200 MG tablet Take 2 tablets (400 mg total) by mouth 2 (two) times daily. 60 tablet 2  . lisinopril (PRINIVIL,ZESTRIL) 20 MG tablet Take 1 tablet (20 mg total) by mouth daily. 30 tablet 2  . metFORMIN (GLUCOPHAGE) 500 MG tablet Take  1 tablet (500 mg total) by mouth 2 (two) times daily with a meal. 60 tablet 2  . Multiple Vitamin (MULTIVITAMIN WITH MINERALS) TABS tablet Take 1 tablet by mouth daily.     No  facility-administered medications prior to visit.     PAST MEDICAL HISTORY: Past Medical History:  Diagnosis Date  . Diabetes mellitus without complication (HCC)   . Hypertension   . Stroke [facility](HCC)     PAST SURGICAL HISTORY: Past Surgical History:  Procedure Laterality Date  . VASECTOMY  2013    FAMILY HISTORY: Family History  Problem Relation Age of Onset  . Diabetes Mother   . Hypertension Mother   . Stroke Mother        TIA  . Hypertension Father   . Cancer Neg Hx   . Heart disease Neg Hx     SOCIAL HISTORY: Social History   Socioeconomic History  . Marital status: Married    Spouse name: Not on file  . Number of children: Not on file  . Years of education: Not on file  . Highest education level: Not on file  Social Needs  . Financial resource strain: Not on file  . Food insecurity - worry: Not on file  . Food insecurity - inability: Not on file  . Transportation needs - medical: Not on file  . Transportation needs - non-medical: Not on file  Occupational History  . Not on file  Tobacco Use  . Smoking status: Never Smoker  . Smokeless tobacco: Never Used  Substance and Sexual Activity  . Alcohol use: No  . Drug use: No  . Sexual activity: Not on file  Other Topics Concern  . Not on file  Social History Narrative   Married, 9yo and 4yo sons.   Exercise - walks some, running after kids.   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       PHYSICAL EXAM  There were no vitals filed for this visit. There is no height or weight on file to calculate BMI.  Generalized: Well developed, in no acute distress  Head: normocephalic and atraumatic,. Oropharynx benign  Neck: Supple, no carotid bruits  Cardiac: Regular rate rhythm, no murmur  Musculoskeletal: No deformity   Neurological examination   Mentation: Alert oriented to time, place, history taking. Attention span and concentration appropriate. Recent and remote memory intact.  Follows all commands speech and  language fluent.   Cranial nerve II-XII: Fundoscopic exam reveals sharp disc margins.Pupils were equal round reactive to light extraocular movements were full, visual field were full on confrontational test. Facial sensation and strength were normal. hearing was intact to finger rubbing bilaterally. Uvula tongue midline. head turning and shoulder shrug were normal and symmetric.Tongue protrusion into cheek strength was normal. Motor: normal bulk and tone, full strength in the BUE, BLE, fine finger movements normal, no pronator drift. No focal weakness Sensory: normal and symmetric to light touch, pinprick, and  Vibration, proprioception  Coordination: finger-nose-finger, heel-to-shin bilaterally, no dysmetria Reflexes: Brachioradialis 2/2, biceps 2/2, triceps 2/2, patellar 2/2, Achilles 2/2, plantar responses were flexor bilaterally. Gait and Station: Rising up from seated position without assistance, normal stance,  moderate stride, good arm swing, smooth turning, able to perform tiptoe, and heel walking without difficulty. Tandem gait is steady  DIAGNOSTIC DATA (LABS, IMAGING, TESTING) - I reviewed patient records, labs, notes, testing and imaging myself where available.  Lab Results  Component Value Date   WBC 5.7 04/21/2016   HGB 13.3 04/21/2016   HCT  41.0 04/21/2016   MCV 86.9 04/21/2016   PLT 230 04/21/2016      Component Value Date/Time   NA 140 04/21/2016 0527   K 3.7 04/21/2016 0527   CL 101 04/21/2016 0527   CO2 29 04/21/2016 0527   GLUCOSE 142 (H) 04/21/2016 0527   BUN 19 04/21/2016 0527   CREATININE 1.40 (H) 04/21/2016 0527   CREATININE 1.19 09/07/2012 1236   CALCIUM 9.5 04/21/2016 0527   PROT 7.5 04/17/2016 0345   ALBUMIN 3.4 (L) 04/17/2016 0345   AST 20 04/17/2016 0345   ALT 19 04/17/2016 0345   ALKPHOS 106 04/17/2016 0345   BILITOT 0.5 04/17/2016 0345   GFRNONAA 59 (L) 04/21/2016 0527   GFRAA >60 04/21/2016 0527   Lab Results  Component Value Date   CHOL 198  04/17/2016   HDL 60 04/17/2016   LDLCALC 120 (H) 04/17/2016   TRIG 91 04/17/2016   CHOLHDL 3.3 04/17/2016   Lab Results  Component Value Date   HGBA1C 8.7 (H) 04/17/2016   No results found for: VITAMINB12 Lab Results  Component Value Date   TSH 1.398 09/07/2012      ASSESSMENT AND PLAN   46 -year-old African-American male with 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 Vascular risk factors of hypertension, diabetes, hyperlipidemia and obesity    PLAN: I had a long d/w patient about his recent hemorrhagic stroke, risk for recurrent stroke/TIAs, personally independently reviewed imaging studies and stroke evaluation results and answered questions.Start aspirin 81 mg daily  for secondary stroke prevention and maintain strict control of hypertension with blood pressure goal below 130/90, diabetes with hemoglobin A1c goal below 6.5% and lipids with LDL cholesterol goal below 70 mg/dL. I also advised the patient to eat a healthy diet with plenty of whole grains, cereals, fruits and vegetables   Nilda Riggs, [facility], [facility], APRN  [facility] [address] [place], [place] [address] [phone]

## 2017-03-30 ENCOUNTER — Ambulatory Visit: Payer: Self-pay | Admitting: Nurse Practitioner

## 2017-03-31 ENCOUNTER — Encounter: Payer: Self-pay | Admitting: Nurse Practitioner

## 2017-05-17 ENCOUNTER — Other Ambulatory Visit: Payer: Self-pay | Admitting: Internal Medicine

## 2017-05-17 DIAGNOSIS — E1165 Type 2 diabetes mellitus with hyperglycemia: Secondary | ICD-10-CM | POA: Diagnosis not present

## 2017-05-17 DIAGNOSIS — I619 Nontraumatic intracerebral hemorrhage, unspecified: Secondary | ICD-10-CM | POA: Diagnosis not present

## 2017-05-17 DIAGNOSIS — I150 Renovascular hypertension: Secondary | ICD-10-CM | POA: Diagnosis not present

## 2017-05-17 DIAGNOSIS — I10 Essential (primary) hypertension: Secondary | ICD-10-CM | POA: Diagnosis not present

## 2017-05-24 ENCOUNTER — Other Ambulatory Visit: Payer: 59

## 2017-05-26 ENCOUNTER — Other Ambulatory Visit: Payer: 59

## 2017-06-14 ENCOUNTER — Ambulatory Visit
Admission: RE | Admit: 2017-06-14 | Discharge: 2017-06-14 | Disposition: A | Discharge status: Home / Self Care | Payer: 59 | Source: Ambulatory Visit | Attending: Internal Medicine | Admitting: Internal Medicine

## 2017-06-14 ENCOUNTER — Other Ambulatory Visit: Payer: 59

## 2017-06-14 DIAGNOSIS — I10 Essential (primary) hypertension: Secondary | ICD-10-CM

## 2017-06-14 DIAGNOSIS — I150 Renovascular hypertension: Secondary | ICD-10-CM | POA: Diagnosis not present

## 2017-09-13 DIAGNOSIS — I10 Essential (primary) hypertension: Secondary | ICD-10-CM | POA: Diagnosis not present

## 2017-09-13 DIAGNOSIS — E1169 Type 2 diabetes mellitus with other specified complication: Secondary | ICD-10-CM | POA: Diagnosis not present

## 2017-09-13 DIAGNOSIS — I619 Nontraumatic intracerebral hemorrhage, unspecified: Secondary | ICD-10-CM | POA: Diagnosis not present

## 2017-10-26 DIAGNOSIS — E1169 Type 2 diabetes mellitus with other specified complication: Secondary | ICD-10-CM | POA: Diagnosis not present

## 2017-10-26 DIAGNOSIS — I10 Essential (primary) hypertension: Secondary | ICD-10-CM | POA: Diagnosis not present

## 2018-04-21 DIAGNOSIS — Z125 Encounter for screening for malignant neoplasm of prostate: Secondary | ICD-10-CM | POA: Diagnosis not present

## 2018-04-21 DIAGNOSIS — Z0000 Encounter for general adult medical examination without abnormal findings: Secondary | ICD-10-CM | POA: Diagnosis not present

## 2018-04-21 DIAGNOSIS — Z1389 Encounter for screening for other disorder: Secondary | ICD-10-CM | POA: Diagnosis not present

## 2018-04-21 DIAGNOSIS — I10 Essential (primary) hypertension: Secondary | ICD-10-CM | POA: Diagnosis not present

## 2018-04-21 DIAGNOSIS — E1169 Type 2 diabetes mellitus with other specified complication: Secondary | ICD-10-CM | POA: Diagnosis not present

## 2018-04-21 DIAGNOSIS — I619 Nontraumatic intracerebral hemorrhage, unspecified: Secondary | ICD-10-CM | POA: Diagnosis not present

## 2018-04-21 DIAGNOSIS — E119 Type 2 diabetes mellitus without complications: Secondary | ICD-10-CM | POA: Diagnosis not present

## 2018-04-21 DIAGNOSIS — Z23 Encounter for immunization: Secondary | ICD-10-CM | POA: Diagnosis not present

## 2018-04-21 DIAGNOSIS — E7800 Pure hypercholesterolemia, unspecified: Secondary | ICD-10-CM | POA: Diagnosis not present

## 2018-06-23 DIAGNOSIS — I10 Essential (primary) hypertension: Secondary | ICD-10-CM | POA: Diagnosis not present

## 2018-08-09 DIAGNOSIS — E7800 Pure hypercholesterolemia, unspecified: Secondary | ICD-10-CM | POA: Diagnosis not present

## 2018-08-09 DIAGNOSIS — I10 Essential (primary) hypertension: Secondary | ICD-10-CM | POA: Diagnosis not present

## 2018-08-09 DIAGNOSIS — E1169 Type 2 diabetes mellitus with other specified complication: Secondary | ICD-10-CM | POA: Diagnosis not present

## 2018-12-17 IMAGING — US US RENAL ARTERY STENOSIS
1 series · 14 of 25 positions shown · non-contrast
Comparison: None.

CLINICAL DATA: Hypertension.  Diabetes.  Hyperlipidemia.

EXAM:
RENAL/URINARY TRACT ULTRASOUND
RENAL DUPLEX DOPPLER ULTRASOUND

[Series 1: us renal artery stenosis · 0.28mm/px · 14 of 66 slices shown]
[im 1/66]
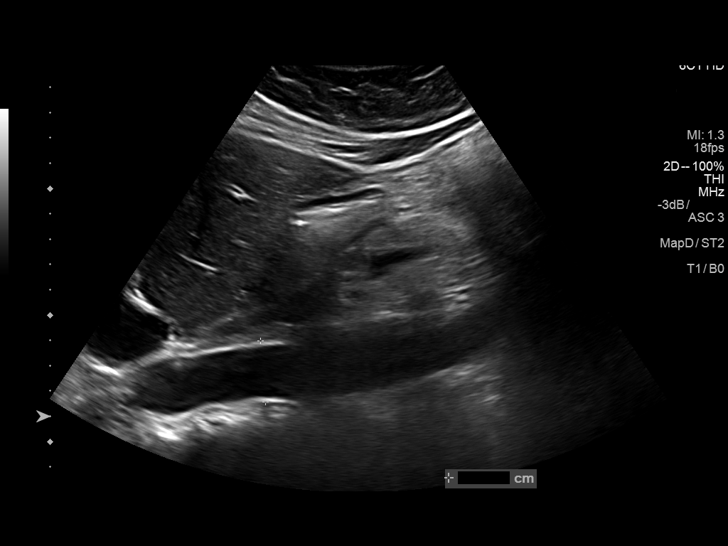
[im 6/66]
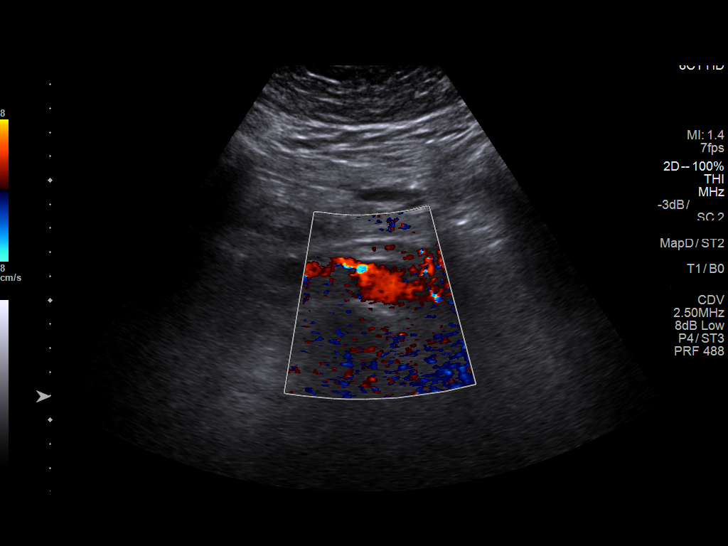
[im 11/66]
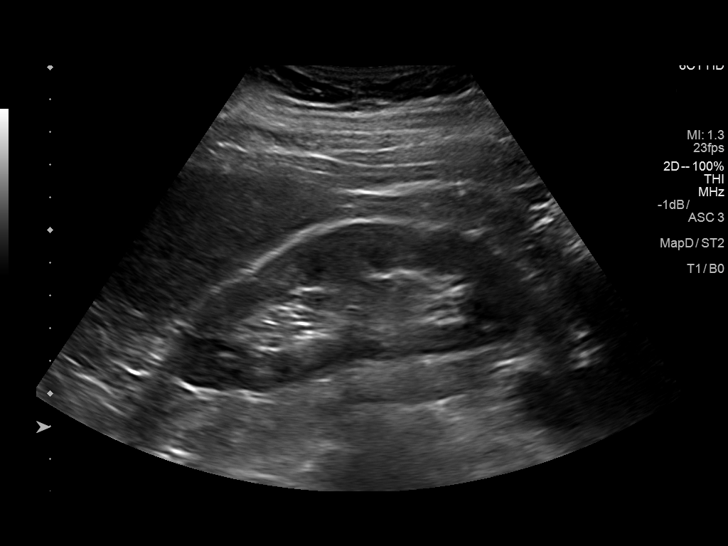
[im 17/66]
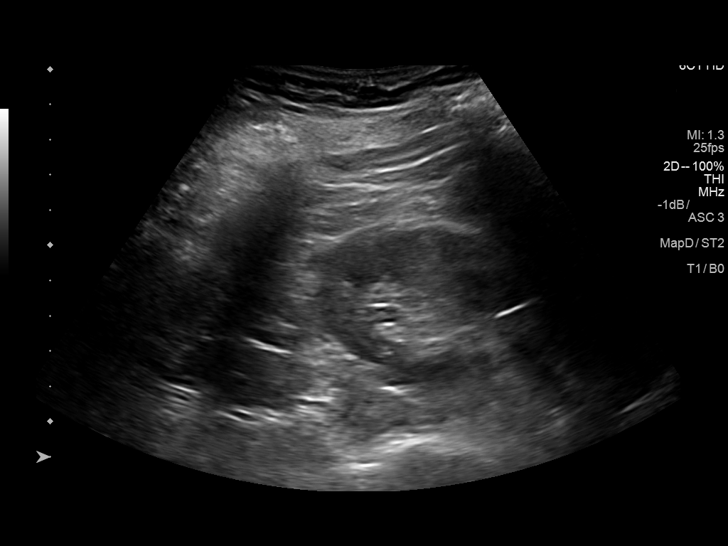
[im 22/66]
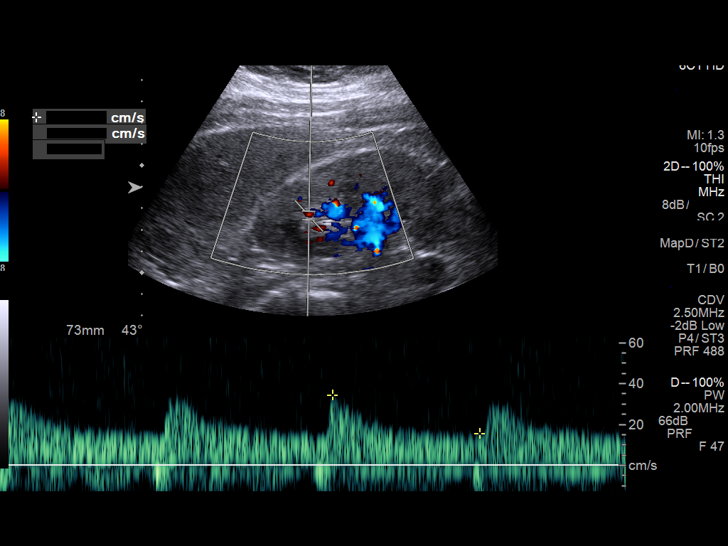
[im 25/66]
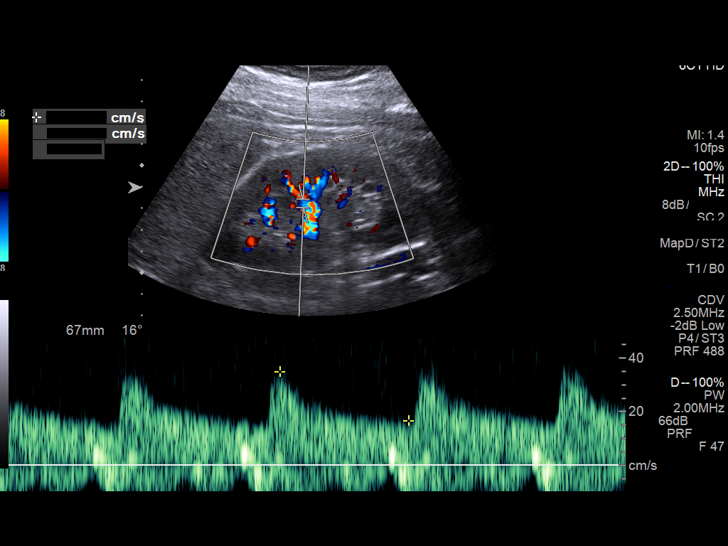
[im 30/66]
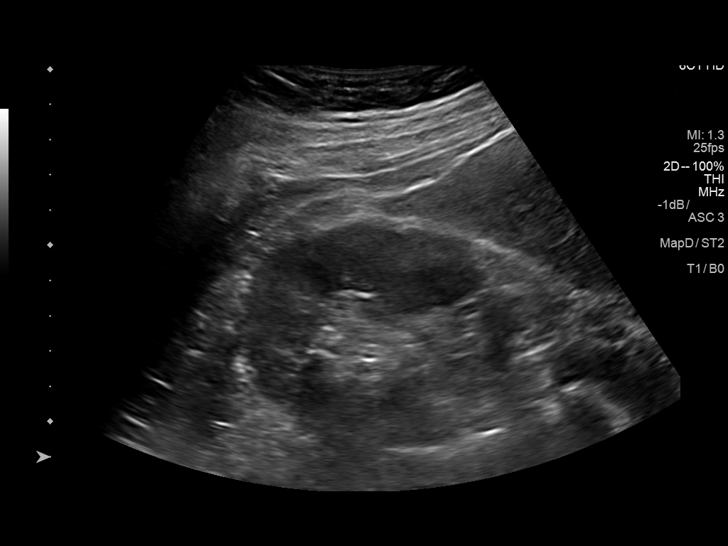
[im 36/66]
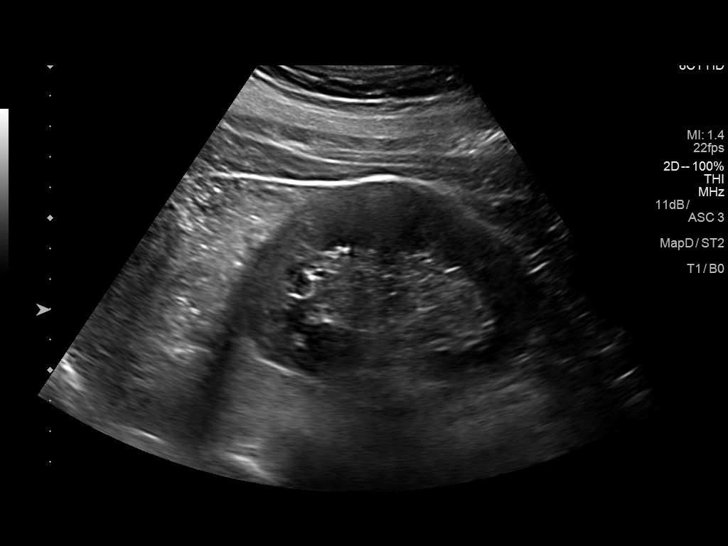
[im 41/66]
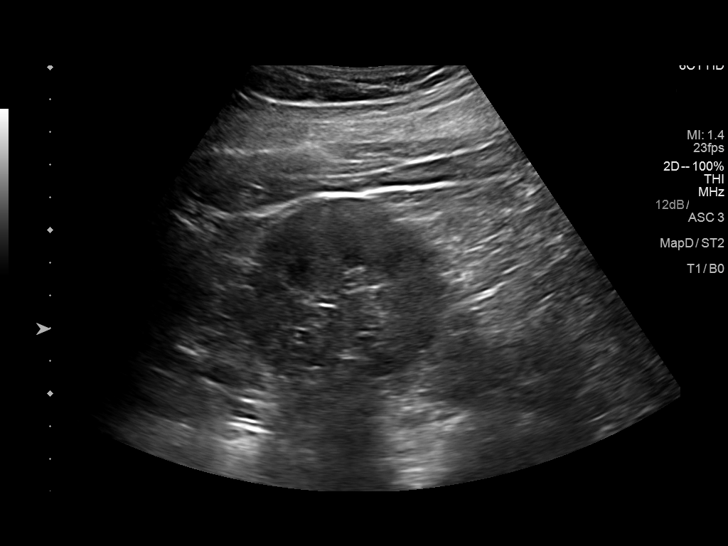
[im 44/66]
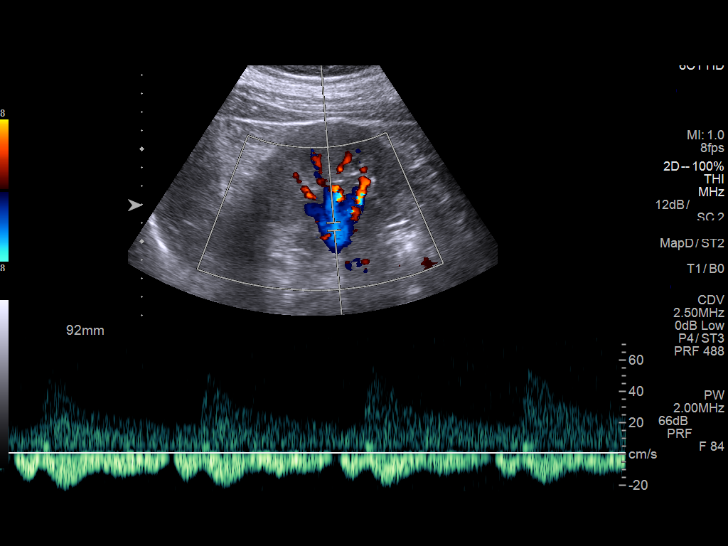
[im 49/66]
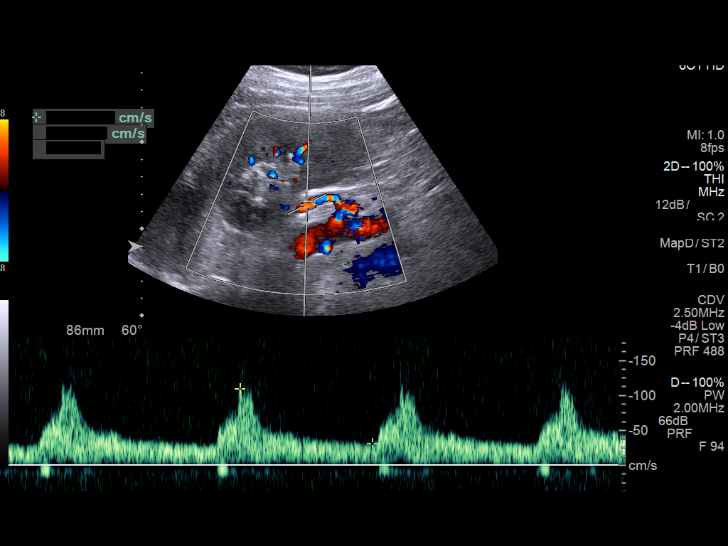
[im 55/66]
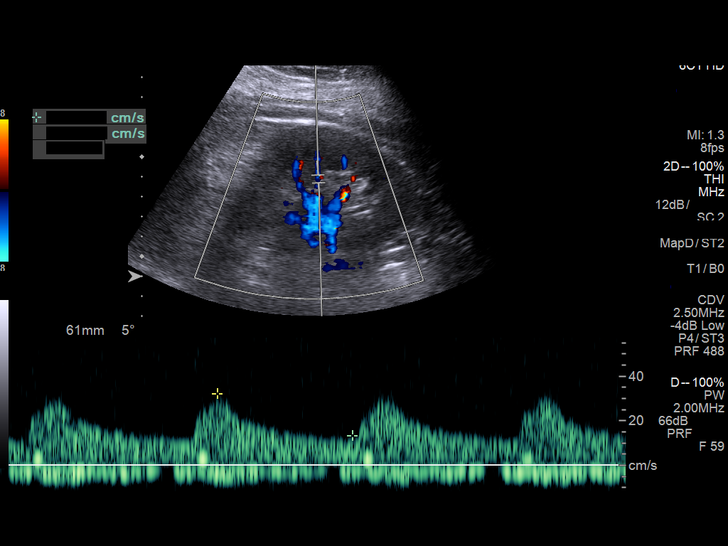
[im 60/66]
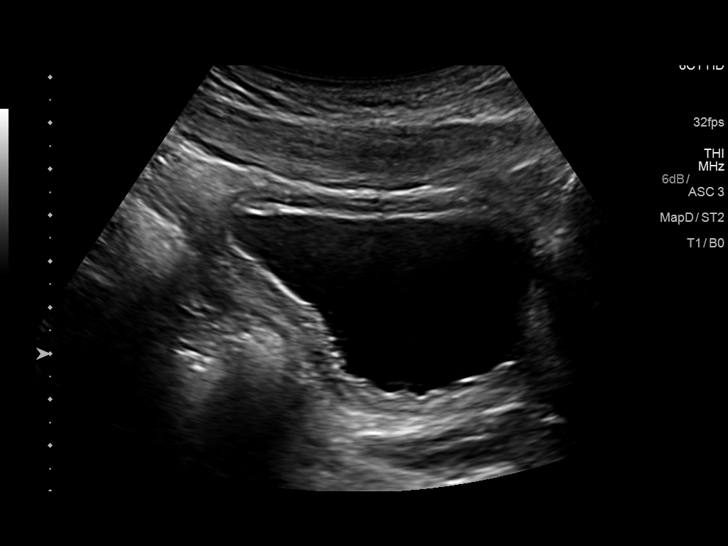
[im 66/66]
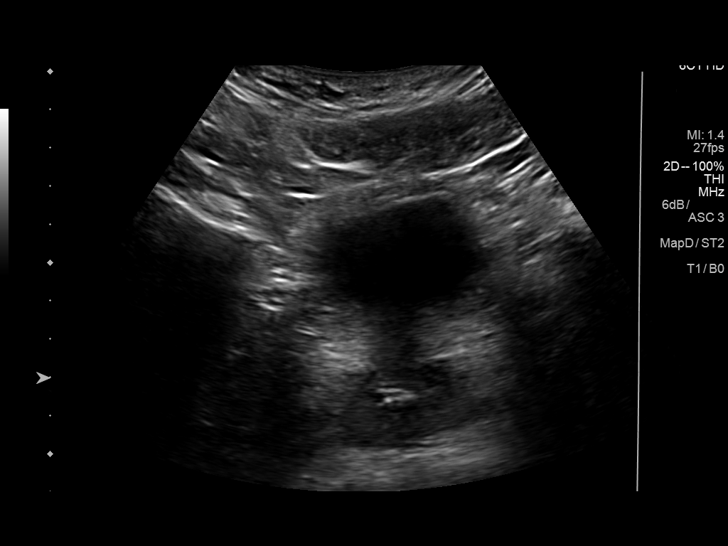

[14 of 25 positions shown; findings below may reference images not displayed]

FINDINGS: Right Kidney:

Length: 11.2 cm. Echogenicity within normal limits. No mass or
hydronephrosis visualized.

Left Kidney:

Length: 10.4 cm. Echogenicity within normal limits. No mass or
hydronephrosis visualized.

Bladder:  Unremarkable.

RENAL DUPLEX ULTRASOUND

Right Renal Artery Velocities:

Origin:  122 cm/sec

Mid:  95 cm/sec

Hilum:  54 cm/sec

Interlobar:  35 cm/sec

Arcuate:  23 cm/sec

Left Renal Artery Velocities:

Origin:  83 cm/sec

Mid:  110 cm/sec

Hilum:  62 cm/sec

Interlobar:  37 cm/sec

Arcuate:  25 cm/sec

Aortic Velocity:  107 cm/sec

Right Renal-Aortic Ratios:

Origin:

Mid:

Hilum:

Interlobar:

Arcuate:

Left Renal-Aortic Ratios:

Origin:

Mid:

Hilum:

Interlobar:

Arcuate:

Renal veins are patent bilaterally. Doppler analysis of the renal
arteries and its branches demonstrate a widespread low resistance
pattern.
IMPRESSION: No evidence of significant renal artery stenosis.

No significant acute renal pathology. No evidence of hydronephrosis.

## 2021-06-17 ENCOUNTER — Other Ambulatory Visit: Payer: Self-pay | Admitting: Nephrology

## 2021-06-17 DIAGNOSIS — N182 Chronic kidney disease, stage 2 (mild): Secondary | ICD-10-CM

## 2021-06-26 ENCOUNTER — Ambulatory Visit (HOSPITAL_BASED_OUTPATIENT_CLINIC_OR_DEPARTMENT_OTHER): Payer: 59 | Admitting: Cardiovascular Disease

## 2021-06-27 ENCOUNTER — Encounter (HOSPITAL_BASED_OUTPATIENT_CLINIC_OR_DEPARTMENT_OTHER): Payer: Self-pay

## 2021-08-12 ENCOUNTER — Ambulatory Visit (HOSPITAL_BASED_OUTPATIENT_CLINIC_OR_DEPARTMENT_OTHER): Payer: 59 | Admitting: Cardiovascular Disease

## 2021-10-16 ENCOUNTER — Ambulatory Visit (HOSPITAL_BASED_OUTPATIENT_CLINIC_OR_DEPARTMENT_OTHER): Payer: 59 | Admitting: Family

## 2021-11-07 ENCOUNTER — Ambulatory Visit (HOSPITAL_BASED_OUTPATIENT_CLINIC_OR_DEPARTMENT_OTHER): Payer: 59 | Admitting: Cardiovascular Disease

## 2021-11-12 NOTE — Progress Notes (Unsigned)
[facility] Initial Assessment:    Date:  11/15/2021   ID:  Danny Stevens, DOB April 22, 1971, MRN [id number]  PCP:  Georgann Housekeeper, MD  Cardiologist:  None  Nephrologist:  Referring MD: Delma Officer, PA   CC: Hypertension  History of Present Illness:    Danny Stevens is a 51 y.o. male with a hx of hypertension, DM2, hyperlipidemia, hemorrhagic CVA 2017 here to establish care in the [facility].   At visit 04/2021 with PCP Clonidine increased to 0.3mg  TID. Imdur 30mg  added. Referred to [facility]. Losartan 100mg  QD, Amlodipine 10mg , Coreg 25mg  BID, HCTZ 25mg  QD were continued.   Danny Stevens was diagnosed with hypertension in 2017 at time of his hemorrhagic stroke. It has been difficult to control. Blood pressure checked with arm cuff at home intermittently most often 160s. No prior tobacco use, does not drink alcohol. For exercise he mows his yard with auto-push mower. he eats at home and outside of the home and does follow low sodium diet per his report. His wife does most of the cooking. He does not snore and wakes feeling well rested. No chest pain, dyspnea, orthopnea, PND.   He works as a for . Does not recent stress as his father passed away unexpectedly and the funeral service is Sunday.   Previous antihypertensives: Lisinopril - lip swelling (tolerates ARB)  Past Medical History:  Diagnosis Date   Diabetes mellitus without complication (HCC)    Hypertension    Stroke (HCC)    (a) approx 2018    Past Surgical History:  Procedure Laterality Date   VASECTOMY  2013    Current Medications: Current Meds  Medication Sig   atorvastatin (LIPITOR) 40 MG tablet Take 1 tablet (40 mg total) by mouth daily at 6 PM.   carvedilol (COREG) 25 MG tablet Take 25 mg by mouth 2 (two) times daily with a meal.   cloNIDine (CATAPRES) 0.3 MG tablet Take 0.3 mg by mouth 2 (two) times daily.    empagliflozin (JARDIANCE) 10 MG TABS tablet Take 25 mg by mouth daily.   glimepiride (AMARYL) 2 MG tablet Take 2 mg by mouth daily with breakfast.   hydrALAZINE (APRESOLINE) 100 MG tablet Take 100 mg by mouth 2 (two) times daily.   isosorbide mononitrate (IMDUR) 30 MG 24 hr tablet Take 30 mg by mouth daily.   metFORMIN (GLUCOPHAGE) 500 MG tablet Take 1 tablet (500 mg total) by mouth 2 (two) times daily with a meal.   Multiple Vitamin (MULTIVITAMIN WITH MINERALS) TABS tablet Take 1 tablet by mouth daily.   spironolactone (ALDACTONE) 50 MG tablet Take 50 mg by mouth daily.   valsartan (DIOVAN) 320 MG tablet Take 1 tablet (320 mg total) by mouth daily.   [DISCONTINUED] amLODipine (NORVASC) 10 MG tablet Take 1 tablet (10 mg total) by mouth daily.   [DISCONTINUED] labetalol (NORMODYNE) 200 MG tablet Take 2 tablets (400 mg total) by mouth 2 (two) times daily.   [DISCONTINUED] losartan (COZAAR) 100 MG tablet Take 100 mg by mouth daily.     Allergies:   Lisinopril   Social History   Socioeconomic History   Marital status: Married    Spouse name: Not on file   Number of children: Not on file   Years of education: Not on file   Highest education level: Not on file  Occupational History   Not on file  Tobacco Use   Smoking status: Never   Smokeless tobacco:  Never  Substance and Sexual Activity   Alcohol use: No   Drug use: No   Sexual activity: Not on file  Other Topics Concern   Not on file  Social History Narrative   Married, 9yo and 4yo sons.   Exercise - walks some, running after kids.   Works for [employer], Civil Service fast streamer.     Social Determinants of Health   Financial Resource Strain: Low Risk  (11/13/2021)   Overall Financial Resource Strain (CARDIA)    Difficulty of Paying Living Expenses: Not hard at all  Food Insecurity: No Food Insecurity (11/13/2021)   Hunger Vital Sign    Worried About Running Out of Food in the Last Year: Never true    Ran Out of Food in the Last Year:  Never true  Transportation Needs: No Transportation Needs (11/13/2021)   PRAPARE - Administrator, Civil Service (Medical): No    Lack of Transportation (Non-Medical): No  Physical Activity: Insufficiently Active (11/13/2021)   Exercise Vital Sign    Days of Exercise per Week: 7 days    Minutes of Exercise per Session: 10 min  Stress: No Stress Concern Present (11/13/2021)   [employer] of Occupational Health - Occupational Stress Questionnaire    Feeling of Stress : Not at all  Social Connections: Not on file     Family History: The patient's family history includes Diabetes in his mother; Hypertension in his father and mother; Stroke in his mother. There is no history of Cancer or Heart disease.  ROS:   Please see the history of present illness.     All other systems reviewed and are negative.  EKGs/Labs/Other Studies Reviewed:    EKG:  EKG is  ordered today.  The ekg ordered today demonstrates SB 56 bpm with LVH.   Recent Labs: No results found for requested labs within last 365 days.   Recent Lipid Panel    Component Value Date/Time   CHOL 198 04/17/2016 1012   TRIG 91 04/17/2016 1012   HDL 60 04/17/2016 1012   CHOLHDL 3.3 04/17/2016 1012   VLDL 18 04/17/2016 1012   LDLCALC 120 (H) 04/17/2016 1012    Physical Exam:   VS:  BP (!) 190/100   Pulse (!) 56   Ht 6' (1.829 m)   Wt 224 lb (101.6 kg)   BMI 30.38 kg/m  , BMI Body mass index is 30.38 kg/m. GENERAL:  Well appearing HEENT: Pupils equal round and reactive, fundi not visualized, oral mucosa unremarkable NECK:  No jugular venous distention, waveform within normal limits, carotid upstroke brisk and symmetric, no bruits, no thyromegaly LYMPHATICS:  No cervical adenopathy LUNGS:  Clear to auscultation bilaterally HEART:  RRR.  PMI not displaced or sustained,S1 and S2 within normal limits, no S3, no S4, no clicks, no rubs, no murmurs ABD:  Flat, positive bowel sounds normal in frequency in pitch,  no bruits, no rebound, no guarding, no midline pulsatile mass, no hepatomegaly, no splenomegaly EXT:  2 plus pulses throughout, no edema, no cyanosis no clubbing SKIN:  No rashes no nodules NEURO:  Cranial nerves II through XII grossly intact, motor grossly intact throughout PSYCH:  Cognitively intact, oriented to person place and time   ASSESSMENT/PLAN:    HTN - BP not at goal <130/80. STOP losartan, START Valsartan 320mg  QD Continue Amlodipine 10mg  QD, Carvedilol 25mg  BID, Clonidine 0.3mg  BID, Hydralazine 100mg  BED, Imdur 30mg  QD, Spironolactone 50mg  QD. Asked to bring pill bottles to next appt as  somewhat difficult to determine which medications he is actually taking. Pending response to Valsartan, consider combination BP tablet at follow up for easier medication management.  BMP/TSH in 1 week. Hesitant to stop ARB due to elevated pressures to check renin/aldosterone level. Renal duplex 05/2017 no significant stenosis. Will reach out to PCP as they ordered repeat study 05/2021 but unclear if it was performed.  Referred to PREP  HLD -LDL goal less than 70 due to history of CVA.  Continue atorvastatin 40 mg daily.  Denies myalgias.  DM2 - Continue to follow with PCP.  Appreciate inclusion of SGLT2i for cardioprotective benefit.   Hx of CVA - hemorrhagic stroke in 2017. Continue statin.   Screening for Secondary Hypertension:     11/15/2021   11:38 AM  Causes  Renovascular HTN Screened     - Comments 2019 renal duplex WNL  Sleep Apnea Screened     - Comments No snoring  Thyroid Disease Screened  Hyperaldosteronism Not Screened  Coarctation of the Aorta Screened     - Comments BP symmetrical  Compliance Not Screened     - Comments Bring pill bottles to next appt    Relevant Labs/Studies:    Latest Ref Rng & Units 04/21/2016    5:27 AM 04/19/2016    3:26 AM 04/18/2016    3:28 AM  Basic Labs  Sodium 135 - 145 mmol/L 140  137  138   Potassium 3.5 - 5.1 mmol/L 3.7  3.4  3.1    Creatinine 0.61 - 1.24 mg/dL 6.38  1.77  1.16        Latest Ref Rng & Units 09/07/2012   12:36 PM  Thyroid   TSH 0.350 - 4.500 uIU/mL 1.398                 he consents to be monitored in our remote patient monitoring program through [employer].  he will track his blood pressure twice daily and understands that these trends will help [place] to adjust his medications as needed prior to his next appointment.  he interested in enrolling in the PREP exercise and nutrition program through the [facility].    Disposition:    FU with PharmD in 1 month and Dr. Duke Salvia in 4 months   Medication Adjustments/Labs and Tests Ordered: Current medicines are reviewed at length with the patient today.  Concerns regarding medicines are outlined above.  Orders Placed This Encounter  Procedures   Basic metabolic panel   TSH   Amb Referral To Provider Referral Exercise Program (P.R.E.P)   Cantril's Ladder Assessment   EKG 12-Lead   Meds ordered this encounter  Medications   valsartan (DIOVAN) 320 MG tablet    Sig: Take 1 tablet (320 mg total) by mouth daily.    Dispense:  90 tablet    Refill:  3   amLODipine (NORVASC) 10 MG tablet    Sig: Take 1 tablet (10 mg total) by mouth daily.    Dispense:  30 tablet    Refill:  2     Signed, Alver Sorrow, NP  11/15/2021 11:39 AM    [facility]

## 2021-11-13 ENCOUNTER — Encounter (HOSPITAL_BASED_OUTPATIENT_CLINIC_OR_DEPARTMENT_OTHER): Payer: Self-pay | Admitting: Family

## 2021-11-13 ENCOUNTER — Ambulatory Visit (HOSPITAL_BASED_OUTPATIENT_CLINIC_OR_DEPARTMENT_OTHER): Payer: 59 | Admitting: Family

## 2021-11-13 VITALS — BP 190/100 | HR 56 | Ht 72.0 in | Wt 224.0 lb

## 2021-11-13 DIAGNOSIS — E785 Hyperlipidemia, unspecified: Secondary | ICD-10-CM | POA: Diagnosis not present

## 2021-11-13 DIAGNOSIS — Z006 Encounter for examination for normal comparison and control in clinical research program: Secondary | ICD-10-CM

## 2021-11-13 DIAGNOSIS — I10 Essential (primary) hypertension: Secondary | ICD-10-CM | POA: Diagnosis not present

## 2021-11-13 DIAGNOSIS — Z8673 Personal history of transient ischemic attack (TIA), and cerebral infarction without residual deficits: Secondary | ICD-10-CM | POA: Diagnosis not present

## 2021-11-13 MED ORDER — AMLODIPINE BESYLATE 10 MG PO TABS: 10.0000 mg | ORAL_TABLET | Freq: Every day | ORAL | 2 refills | Status: AC

## 2021-11-13 MED ORDER — VALSARTAN 320 MG PO TABS
320.0000 mg | ORAL_TABLET | Freq: Every day | ORAL | 3 refills | Status: AC
Start: 2021-11-13 — End: ?

## 2021-11-13 NOTE — Patient Instructions (Signed)
Medication Instructions:  Your physician has recommended you make the following change in your medication:   Stop: Losartan   Start: Valsartan 320mg  daily   Please bring all pill bottles, blood pressure cuff, and blood pressure recordings to next appointment.    Labwork: Please return for Lab work in one week for BMP/TSH. You may come to the...   [facility] (3rd floor) [address] [place], [place]  Open: 8am-Noon and 1pm-4:30pm  Please ring the doorbell on the small table when you exit the elevator and the Lab Tech will come get you  [facility] at [facility] [address], [place], [place] Open: 8am-1pm, then 2pm-4:30pm   [facility]- Please see attached locations sheet stapled to your lab work with address and hours.     Follow-Up: FRIDAY AUGUST 25TH AT 1030AM AT [facility] OFFICE WITH PHARMD    Referrals:  We have referred you to the PREP workout program, they will call to get you started.    Special Instructions:  DASH Eating Plan DASH stands for Dietary Approaches to Stop Hypertension. The DASH eating plan is a healthy eating plan that has been shown to: Reduce high blood pressure (hypertension). Reduce your risk for type 2 diabetes, heart disease, and stroke. Help with weight loss. What are tips for following this plan? Reading food labels Check food labels for the amount of salt (sodium) per serving. Choose foods with less than 5 percent of the Daily Value of sodium. Generally, foods with less than 300 milligrams (mg) of sodium per serving fit into this eating plan. To find whole grains, look for the word "whole" as the first word in the ingredient list. Shopping Buy products labeled as "low-sodium" or "no salt added." Buy fresh foods. Avoid canned foods and pre-made or frozen meals. Cooking Avoid adding salt when cooking. Use salt-free seasonings or herbs instead of table salt or sea salt. Check  with your health care provider or pharmacist before using salt substitutes. Do not fry foods. Cook foods using healthy methods such as baking, boiling, grilling, roasting, and broiling instead. Cook with heart-healthy oils, such as olive, canola, avocado, soybean, or sunflower oil. Meal planning  Eat a balanced diet that includes: 4 or more servings of fruits and 4 or more servings of vegetables each day. Try to fill one-half of your plate with fruits and vegetables. 6-8 servings of whole grains each day. Less than 6 oz (170 g) of lean meat, poultry, or fish each day. A 3-oz (85-g) serving of meat is about the same size as a deck of cards. One egg equals 1 oz (28 g). 2-3 servings of low-fat dairy each day. One serving is 1 cup (237 mL). 1 serving of nuts, seeds, or beans 5 times each week. 2-3 servings of heart-healthy fats. Healthy fats called omega-3 fatty acids are found in foods such as walnuts, flaxseeds, fortified milks, and eggs. These fats are also found in cold-water fish, such as sardines, salmon, and mackerel. Limit how much you eat of: Canned or prepackaged foods. Food that is high in trans fat, such as some fried foods. Food that is high in saturated fat, such as fatty meat. Desserts and other sweets, sugary drinks, and other foods with added sugar. Full-fat dairy products. Do not salt foods before eating. Do not eat more than 4 egg yolks a week. Try to eat at least 2 vegetarian meals a week. Eat more home-cooked food and less restaurant, buffet,  and fast food. Lifestyle When eating at a restaurant, ask that your food be prepared with less salt or no salt, if possible. If you drink alcohol: Limit how much you use to: 0-1 drink a day for women who are not pregnant. 0-2 drinks a day for men. Be aware of how much alcohol is in your drink. In the [place] one drink equals one 12 oz bottle of beer (355 mL), one 5 oz glass of wine (148 mL), or one 1 oz glass of hard liquor (44  mL). General information Avoid eating more than 2,300 mg of salt a day. If you have hypertension, you may need to reduce your sodium intake to 1,500 mg a day. Work with your health care provider to maintain a healthy body weight or to lose weight. Ask what an ideal weight is for you. Get at least 30 minutes of exercise that causes your heart to beat faster (aerobic exercise) most days of the week. Activities may include walking, swimming, or biking. Work with your health care provider or dietitian to adjust your eating plan to your individual calorie needs. What foods should I eat? Fruits All fresh, dried, or frozen fruit. Canned fruit in natural juice (without added sugar). Vegetables Fresh or frozen vegetables (raw, steamed, roasted, or grilled). Low-sodium or reduced-sodium tomato and vegetable juice. Low-sodium or reduced-sodium tomato sauce and tomato paste. Low-sodium or reduced-sodium canned vegetables. Grains Whole-grain or whole-wheat bread. Whole-grain or whole-wheat pasta. Brown rice. Orpah Cobb. Bulgur. Whole-grain and low-sodium cereals. Pita bread. Low-fat, low-sodium crackers. Whole-wheat flour tortillas. Meats and other proteins Skinless chicken or [place]. Ground chicken or [place]. Pork with fat trimmed off. Fish and seafood. Egg whites. Dried beans, peas, or lentils. Unsalted nuts, nut butters, and seeds. Unsalted canned beans. Lean cuts of beef with fat trimmed off. Low-sodium, lean precooked or cured meat, such as sausages or meat loaves. Dairy Low-fat (1%) or fat-free (skim) milk. Reduced-fat, low-fat, or fat-free cheeses. Nonfat, low-sodium ricotta or cottage cheese. Low-fat or nonfat yogurt. Low-fat, low-sodium cheese. Fats and oils Soft margarine without trans fats. Vegetable oil. Reduced-fat, low-fat, or light mayonnaise and salad dressings (reduced-sodium). Canola, safflower, olive, avocado, soybean, and sunflower oils. Avocado. Seasonings and condiments Herbs.  Spices. Seasoning mixes without salt. Other foods Unsalted popcorn and pretzels. Fat-free sweets. The items listed above may not be a complete list of foods and beverages you can eat. Contact a dietitian for more information. What foods should I avoid? Fruits Canned fruit in a light or heavy syrup. Fried fruit. Fruit in cream or butter sauce. Vegetables Creamed or fried vegetables. Vegetables in a cheese sauce. Regular canned vegetables (not low-sodium or reduced-sodium). Regular canned tomato sauce and paste (not low-sodium or reduced-sodium). Regular tomato and vegetable juice (not low-sodium or reduced-sodium). Rosita Fire. Olives. Grains Baked goods made with fat, such as croissants, muffins, or some breads. Dry pasta or rice meal packs. Meats and other proteins Fatty cuts of meat. Ribs. Fried meat. Tomasa Blase. Bologna, salami, and other precooked or cured meats, such as sausages or meat loaves. Fat from the back of a pig (fatback). Bratwurst. Salted nuts and seeds. Canned beans with added salt. Canned or smoked fish. Whole eggs or egg yolks. Chicken or [place] with skin. Dairy Whole or 2% milk, cream, and half-and-half. Whole or full-fat cream cheese. Whole-fat or sweetened yogurt. Full-fat cheese. Nondairy creamers. Whipped toppings. Processed cheese and cheese spreads. Fats and oils Butter. Stick margarine. Lard. Shortening. Ghee. Bacon fat. Tropical oils, such as coconut, palm kernel,  or palm oil. Seasonings and condiments Onion salt, garlic salt, seasoned salt, table salt, and sea salt. Worcestershire sauce. Tartar sauce. Barbecue sauce. Teriyaki sauce. Soy sauce, including reduced-sodium. Steak sauce. Canned and packaged gravies. Fish sauce. Oyster sauce. Cocktail sauce. Store-bought horseradish. Ketchup. Mustard. Meat flavorings and tenderizers. Bouillon cubes. Hot sauces. Pre-made or packaged marinades. Pre-made or packaged taco seasonings. Relishes. Regular salad dressings. Other foods Salted  popcorn and pretzels. The items listed above may not be a complete list of foods and beverages you should avoid. Contact a dietitian for more information. Where to find more information National Heart, Lung, and Blood Institute: PopSteam.is American Heart Association: www.heart.org Academy of Nutrition and Dietetics: www.eatright.org National Kidney Foundation: www.kidney.org Summary The DASH eating plan is a healthy eating plan that has been shown to reduce high blood pressure (hypertension). It may also reduce your risk for type 2 diabetes, heart disease, and stroke. When on the DASH eating plan, aim to eat more fresh fruits and vegetables, whole grains, lean proteins, low-fat dairy, and heart-healthy fats. With the DASH eating plan, you should limit salt (sodium) intake to 2,300 mg a day. If you have hypertension, you may need to reduce your sodium intake to 1,500 mg a day. Work with your health care provider or dietitian to adjust your eating plan to your individual calorie needs. This information is not intended to replace advice given to you by your health care provider. Make sure you discuss any questions you have with your health care provider. Document Revised: 03/17/2019 Document Reviewed: 03/17/2019 Elsevier Patient Education  2023 [employer].

## 2021-11-13 NOTE — Research (Signed)
  Subject Name: Danny Stevens met inclusion and exclusion criteria for the Virtual Care and Social Determinant Interventions for the management of hypertension trial.  The informed consent form, study requirements and expectations were reviewed with the subject by Dr. Oval Linsey and myself. The subject was given the opportunity to read the consent and ask questions. The subject verbalized understanding of the trial requirements.  All questions were addressed prior to the signing of the consent form. The subject agreed to participate in the trial and signed the informed consent. The informed consent was obtained prior to performance of any protocol-specific procedures for the subject.  A copy of the signed informed consent was given to the subject and a copy was placed in the subject's medical record.  Danny Stevens was randomized to Group 1.

## 2021-11-15 ENCOUNTER — Encounter (HOSPITAL_BASED_OUTPATIENT_CLINIC_OR_DEPARTMENT_OTHER): Payer: Self-pay | Admitting: Family

## 2021-12-19 ENCOUNTER — Ambulatory Visit: Payer: 59 | Admitting: Pharmacist

## 2021-12-19 ENCOUNTER — Encounter: Payer: Self-pay | Admitting: Pharmacist

## 2021-12-19 VITALS — BP 119/74 | HR 78 | Ht 72.0 in | Wt 226.0 lb

## 2021-12-19 DIAGNOSIS — I10 Essential (primary) hypertension: Secondary | ICD-10-CM | POA: Diagnosis not present

## 2021-12-19 NOTE — Progress Notes (Signed)
Patient ID: Danny Stevens                 DOB: 1971-01-14                      MRN: [id number]     HPI: Danny Stevens is a 51 y.o. male referred by Gillian Shields to HTN clinic. PMH is significant for HTN, T2DM, and intracranial hemorrhage.  Blood pressure very elevated at visit last month with Gillian Shields.  Patient presents today in good spirits. Reports he feels well.  Has not been checking blood pressure at home very frequently. Denies chest pain, SOB, dizziness, or headaches.  Blood pressure 190/110 at last visit however patient reports he was under a lot of stress that morning because his father passed away and he had not taken his AM medications.   Current HTN meds:  Hydrochlorothiazide 25mg  in the morning Spironolactone 50mg  daily Valsartan 320mg  daily Hydralazine 100mg  twice a day Isosorbide 30mg  daily Amlodipine 10mg  daily Carvedilol 25mg  twice a day Clonidine 0.3mg  daily  BP goal: <130/80  Wt Readings from Last 3 Encounters:  11/13/21 224 lb (101.6 kg)  07/23/16 229 lb 12.8 oz (104.2 kg)  05/04/16 234 lb 9.6 oz (106.4 kg)   BP Readings from Last 3 Encounters:  11/13/21 (!) 190/100  07/23/16 (!) 157/84  05/04/16 (!) 152/110   Pulse Readings from Last 3 Encounters:  11/13/21 (!) 56  07/23/16 85  05/04/16 84    Renal function: CrCl cannot be calculated (Patient's most recent lab result is older than the maximum 21 days allowed.).  Past Medical History:  Diagnosis Date   Diabetes mellitus without complication (HCC)    Hypertension    Stroke [facility])    (a) approx 2018    Current Outpatient Medications on File Prior to Visit  Medication Sig Dispense Refill   losartan (COZAAR) 100 MG tablet Take by mouth.     amLODipine (NORVASC) 10 MG tablet Take 1 tablet (10 mg total) by mouth daily. 30 tablet 2   atorvastatin (LIPITOR) 40 MG tablet Take 1 tablet (40 mg total) by mouth daily at 6 PM. 30 tablet 2   carvedilol (COREG) 25 MG tablet Take 25 mg by mouth 2 (two)  times daily with a meal.     cloNIDine (CATAPRES) 0.3 MG tablet Take 0.3 mg by mouth 2 (two) times daily.     empagliflozin (JARDIANCE) 10 MG TABS tablet Take 25 mg by mouth daily.     glimepiride (AMARYL) 2 MG tablet Take 2 mg by mouth daily with breakfast.     hydrALAZINE (APRESOLINE) 100 MG tablet Take 100 mg by mouth 2 (two) times daily.     hydrochlorothiazide (HYDRODIURIL) 25 MG tablet Take 25 mg by mouth every morning.     isosorbide mononitrate (IMDUR) 30 MG 24 hr tablet Take 30 mg by mouth daily.     metFORMIN (GLUCOPHAGE) 500 MG tablet Take 1 tablet (500 mg total) by mouth 2 (two) times daily with a meal. 60 tablet 2   Multiple Vitamin (MULTIVITAMIN WITH MINERALS) TABS tablet Take 1 tablet by mouth daily.     spironolactone (ALDACTONE) 50 MG tablet Take 50 mg by mouth daily.     valsartan (DIOVAN) 320 MG tablet Take 1 tablet (320 mg total) by mouth daily. 90 tablet 3   No current facility-administered medications on file prior to visit.    Allergies  Allergen Reactions   Lisinopril     Other  reaction(s): lip swelling     Assessment/Plan:  1. Hypertension -    Blood pressure today 119/74 which is at goal of <130/80.  Rechecked at 119/70.  Patient denies any hypotensive symptoms. Recommended he check BP at home more frequently and let us know if he experiences any symptoms. Otherwise no changes needed today and will see patient back in 1 month for HTN visit #2.  Continue: Hydrochlorothiazide 25mg  in the morning Spironolactone 50mg  daily Valsartan 320mg  daily Hydralazine 100mg  twice a day Isosorbide 30mg  daily Amlodipine 10mg  daily Carvedilol 25mg  twice a day Clonidine 0.3mg  daily Recheck in 4 weeks  , PharmD, BCACP, CDCES, CPP [address], [address] [place], , Phone: [phone], Fax: [phone]

## 2021-12-19 NOTE — Patient Instructions (Addendum)
It was nice meeting you today  Your blood pressure today is 119/74 which is less than your goal of 130/80  Please continue:  Hydrochlorothiazide 25mg  in the morning Spironolactone 50mg  daily Valsartan 320mg  daily Hydralazine 100mg  twice a day Isosorbide 30mg  daily Amlodipine 10mg  daily Carvedilol 25mg  twice a day Clonidine 0.3mg  daily  I recommend checking your blood pressure at home a few days a week and bring your machine with you next month  Let know if you have any questions  , PharmD, BCACP, CDCES, CPP [address], [address] [place], , Phone: [phone], Fax: [phone]

## 2022-01-16 ENCOUNTER — Ambulatory Visit: Payer: 59 | Attending: Cardiovascular Disease

## 2022-01-16 NOTE — Progress Notes (Deleted)
Patient ID: Danny Stevens                 DOB: 08-20-1970                      MRN: [id number]     HPI: Danny Stevens is a 51 y.o. male referred by Dr. Marland Kitchen to HTN clinic. PMH is significant for  Current HTN meds:  Previously tried:  BP goal:   Family History:   Social History:   Diet:   Exercise:   Home BP readings:   Wt Readings from Last 3 Encounters:  12/19/21 226 lb (102.5 kg)  11/13/21 224 lb (101.6 kg)  07/23/16 229 lb 12.8 oz (104.2 kg)   BP Readings from Last 3 Encounters:  12/19/21 119/74  11/13/21 (!) 190/100  07/23/16 (!) 157/84   Pulse Readings from Last 3 Encounters:  12/19/21 78  11/13/21 (!) 56  07/23/16 85    Renal function: CrCl cannot be calculated (Patient's most recent lab result is older than the maximum 21 days allowed.).  Past Medical History:  Diagnosis Date   Diabetes mellitus without complication ([place])    Hypertension    Stroke [facility])    (a) approx 2018    Current Outpatient Medications on File Prior to Visit  Medication Sig Dispense Refill   amLODipine (NORVASC) 10 MG tablet Take 1 tablet (10 mg total) by mouth daily. 30 tablet 2   atorvastatin (LIPITOR) 40 MG tablet Take 1 tablet (40 mg total) by mouth daily at 6 PM. 30 tablet 2   carvedilol (COREG) 25 MG tablet Take 25 mg by mouth 2 (two) times daily with a meal.     cloNIDine (CATAPRES) 0.3 MG tablet Take 0.3 mg by mouth 2 (two) times daily.     empagliflozin (JARDIANCE) 10 MG TABS tablet Take 25 mg by mouth daily.     glimepiride (AMARYL) 2 MG tablet Take 2 mg by mouth daily with breakfast.     hydrALAZINE (APRESOLINE) 100 MG tablet Take 100 mg by mouth 2 (two) times daily.     hydrochlorothiazide (HYDRODIURIL) 25 MG tablet Take 25 mg by mouth every morning.     isosorbide mononitrate (IMDUR) 30 MG 24 hr tablet Take 30 mg by mouth daily.     metFORMIN (GLUCOPHAGE) 500 MG tablet Take 1 tablet (500 mg total) by mouth 2 (two) times daily with a meal. 60 tablet 2   Multiple Vitamin  (MULTIVITAMIN WITH MINERALS) TABS tablet Take 1 tablet by mouth daily.     spironolactone (ALDACTONE) 50 MG tablet Take 50 mg by mouth daily.     valsartan (DIOVAN) 320 MG tablet Take 1 tablet (320 mg total) by mouth daily. 90 tablet 3   No current facility-administered medications on file prior to visit.    Allergies  Allergen Reactions   Lisinopril     Other reaction(s): lip swelling     Assessment/Plan:  1. Hypertension -
